# Patient Record
Sex: Female | Born: 1940
Health system: Southern US, Community
[De-identification: ages and names within clinical notes are randomized; demographics above are authoritative.]

## PROBLEM LIST (undated history)

## (undated) ENCOUNTER — Emergency Department (HOSPITAL_COMMUNITY): Payer: Medicare HMO

## (undated) DIAGNOSIS — B399 Histoplasmosis, unspecified: Secondary | ICD-10-CM

## (undated) DIAGNOSIS — K759 Inflammatory liver disease, unspecified: Secondary | ICD-10-CM

## (undated) DIAGNOSIS — J309 Allergic rhinitis, unspecified: Secondary | ICD-10-CM

## (undated) DIAGNOSIS — E162 Hypoglycemia, unspecified: Secondary | ICD-10-CM

## (undated) DIAGNOSIS — L409 Psoriasis, unspecified: Secondary | ICD-10-CM

## (undated) DIAGNOSIS — M810 Age-related osteoporosis without current pathological fracture: Secondary | ICD-10-CM

## (undated) DIAGNOSIS — K859 Acute pancreatitis without necrosis or infection, unspecified: Secondary | ICD-10-CM

## (undated) DIAGNOSIS — J189 Pneumonia, unspecified organism: Secondary | ICD-10-CM

## (undated) DIAGNOSIS — I341 Nonrheumatic mitral (valve) prolapse: Secondary | ICD-10-CM

## (undated) DIAGNOSIS — K5909 Other constipation: Secondary | ICD-10-CM

## (undated) DIAGNOSIS — E785 Hyperlipidemia, unspecified: Secondary | ICD-10-CM

## (undated) DIAGNOSIS — E039 Hypothyroidism, unspecified: Secondary | ICD-10-CM

## (undated) HISTORY — PX: ESOPHAGEAL DILATION: SHX303

## (undated) HISTORY — PX: KNEE ARTHROSCOPY: SUR90

## (undated) HISTORY — DX: Other constipation: K59.09

## (undated) HISTORY — DX: Acute pancreatitis without necrosis or infection, unspecified: K85.90

## (undated) HISTORY — DX: Allergic rhinitis, unspecified: J30.9

## (undated) HISTORY — DX: Age-related osteoporosis without current pathological fracture: M81.0

## (undated) HISTORY — DX: Psoriasis, unspecified: L40.9

## (undated) HISTORY — DX: Histoplasmosis, unspecified: B39.9

## (undated) HISTORY — PX: LIVER RESECTION: SHX1977

## (undated) HISTORY — PX: CATARACT EXTRACTION: SUR2

## (undated) HISTORY — DX: Pneumonia, unspecified organism: J18.9

## (undated) HISTORY — PX: CHOLECYSTECTOMY: SHX55

## (undated) HISTORY — DX: Hypothyroidism, unspecified: E03.9

## (undated) HISTORY — DX: Hyperlipidemia, unspecified: E78.5

## (undated) HISTORY — PX: ABDOMINAL HYSTERECTOMY: SHX81

## (undated) HISTORY — DX: Nonrheumatic mitral (valve) prolapse: I34.1

## (undated) HISTORY — PX: APPENDECTOMY: SHX54

---
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
## 1968-11-03 HISTORY — PX: TOTAL ABDOMINAL HYSTERECTOMY W/ BILATERAL SALPINGOOPHORECTOMY: SHX83

## 1970-11-03 DIAGNOSIS — B399 Histoplasmosis, unspecified: Secondary | ICD-10-CM

## 1970-11-03 HISTORY — DX: Histoplasmosis, unspecified: B39.9

## 1983-11-04 HISTORY — PX: OTHER SURGICAL HISTORY: SHX169

## 1998-03-19 ENCOUNTER — Other Ambulatory Visit: Admission: RE | Admit: 1998-03-19 | Discharge: 1998-03-19 | Payer: Self-pay | Admitting: *Deleted

## 1998-03-28 ENCOUNTER — Ambulatory Visit (HOSPITAL_COMMUNITY): Admission: RE | Admit: 1998-03-28 | Discharge: 1998-03-28 | Payer: Self-pay | Admitting: *Deleted

## 1998-03-30 ENCOUNTER — Ambulatory Visit (HOSPITAL_COMMUNITY): Admission: RE | Admit: 1998-03-30 | Discharge: 1998-03-30 | Payer: Self-pay | Admitting: *Deleted

## 1998-07-31 ENCOUNTER — Ambulatory Visit (HOSPITAL_COMMUNITY): Admission: RE | Admit: 1998-07-31 | Discharge: 1998-07-31 | Payer: Self-pay | Admitting: Gastroenterology

## 1999-03-22 ENCOUNTER — Other Ambulatory Visit: Admission: RE | Admit: 1999-03-22 | Discharge: 1999-03-22 | Payer: Self-pay | Admitting: *Deleted

## 1999-08-03 ENCOUNTER — Encounter: Payer: Self-pay | Admitting: Neurosurgery

## 1999-08-03 ENCOUNTER — Ambulatory Visit (HOSPITAL_COMMUNITY): Admission: RE | Admit: 1999-08-03 | Discharge: 1999-08-03 | Payer: Self-pay | Admitting: Neurosurgery

## 1999-08-03 ENCOUNTER — Encounter: Payer: Self-pay | Admitting: Family Medicine

## 1999-08-19 ENCOUNTER — Ambulatory Visit (HOSPITAL_COMMUNITY): Admission: RE | Admit: 1999-08-19 | Discharge: 1999-08-19 | Payer: Self-pay | Admitting: Neurosurgery

## 1999-08-19 ENCOUNTER — Encounter: Payer: Self-pay | Admitting: Neurosurgery

## 1999-09-02 ENCOUNTER — Encounter: Payer: Self-pay | Admitting: Neurosurgery

## 1999-09-02 ENCOUNTER — Ambulatory Visit (HOSPITAL_COMMUNITY): Admission: RE | Admit: 1999-09-02 | Discharge: 1999-09-02 | Payer: Self-pay | Admitting: Neurosurgery

## 1999-09-05 ENCOUNTER — Ambulatory Visit (HOSPITAL_COMMUNITY): Admission: RE | Admit: 1999-09-05 | Discharge: 1999-09-05 | Payer: Self-pay | Admitting: Family Medicine

## 1999-09-05 ENCOUNTER — Encounter: Payer: Self-pay | Admitting: Family Medicine

## 1999-11-04 DIAGNOSIS — K859 Acute pancreatitis without necrosis or infection, unspecified: Secondary | ICD-10-CM

## 1999-11-04 HISTORY — PX: LYSIS OF ADHESION: SHX5961

## 1999-11-04 HISTORY — DX: Acute pancreatitis without necrosis or infection, unspecified: K85.90

## 1999-12-27 ENCOUNTER — Encounter: Payer: Self-pay | Admitting: Surgery

## 1999-12-31 ENCOUNTER — Inpatient Hospital Stay (HOSPITAL_COMMUNITY): Admission: RE | Admit: 1999-12-31 | Discharge: 2000-01-06 | Payer: Self-pay | Admitting: Surgery

## 2000-04-24 ENCOUNTER — Encounter: Payer: Self-pay | Admitting: Neurosurgery

## 2000-04-24 ENCOUNTER — Ambulatory Visit (HOSPITAL_COMMUNITY): Admission: RE | Admit: 2000-04-24 | Discharge: 2000-04-24 | Payer: Self-pay | Admitting: Neurosurgery

## 2000-05-03 HISTORY — PX: LUMBAR DISC SURGERY: SHX700

## 2000-05-19 ENCOUNTER — Inpatient Hospital Stay (HOSPITAL_COMMUNITY): Admission: RE | Admit: 2000-05-19 | Discharge: 2000-05-19 | Payer: Self-pay | Admitting: Neurosurgery

## 2000-05-19 ENCOUNTER — Encounter: Payer: Self-pay | Admitting: Neurosurgery

## 2000-06-17 ENCOUNTER — Encounter: Admission: RE | Admit: 2000-06-17 | Discharge: 2000-06-17 | Payer: Self-pay | Admitting: Family Medicine

## 2000-06-17 ENCOUNTER — Encounter: Payer: Self-pay | Admitting: Obstetrics & Gynecology

## 2000-06-23 ENCOUNTER — Encounter: Admission: RE | Admit: 2000-06-23 | Discharge: 2000-06-23 | Payer: Self-pay | Admitting: Obstetrics & Gynecology

## 2000-06-23 ENCOUNTER — Encounter: Payer: Self-pay | Admitting: Obstetrics & Gynecology

## 2000-06-23 ENCOUNTER — Other Ambulatory Visit: Admission: RE | Admit: 2000-06-23 | Discharge: 2000-06-23 | Payer: Self-pay | Admitting: Obstetrics and Gynecology

## 2000-07-23 ENCOUNTER — Ambulatory Visit (HOSPITAL_COMMUNITY): Admission: RE | Admit: 2000-07-23 | Discharge: 2000-07-23 | Payer: Self-pay | Admitting: Gastroenterology

## 2000-11-10 ENCOUNTER — Encounter: Payer: Self-pay | Admitting: Emergency Medicine

## 2000-11-10 ENCOUNTER — Inpatient Hospital Stay (HOSPITAL_COMMUNITY): Admission: EM | Admit: 2000-11-10 | Discharge: 2000-11-12 | Payer: Self-pay | Admitting: Emergency Medicine

## 2000-11-11 ENCOUNTER — Encounter: Payer: Self-pay | Admitting: Gastroenterology

## 2000-12-06 ENCOUNTER — Emergency Department (HOSPITAL_COMMUNITY): Admission: EM | Admit: 2000-12-06 | Discharge: 2000-12-06 | Payer: Self-pay | Admitting: Emergency Medicine

## 2001-01-28 ENCOUNTER — Encounter: Admission: RE | Admit: 2001-01-28 | Discharge: 2001-01-28 | Payer: Self-pay | Admitting: Family Medicine

## 2001-01-28 ENCOUNTER — Encounter: Payer: Self-pay | Admitting: Family Medicine

## 2001-05-01 ENCOUNTER — Encounter: Payer: Self-pay | Admitting: Family Medicine

## 2001-05-01 ENCOUNTER — Ambulatory Visit (HOSPITAL_COMMUNITY): Admission: RE | Admit: 2001-05-01 | Discharge: 2001-05-01 | Payer: Self-pay | Admitting: Family Medicine

## 2001-08-18 ENCOUNTER — Other Ambulatory Visit: Admission: RE | Admit: 2001-08-18 | Discharge: 2001-08-18 | Payer: Self-pay | Admitting: *Deleted

## 2002-03-23 ENCOUNTER — Encounter: Payer: Self-pay | Admitting: *Deleted

## 2002-03-23 ENCOUNTER — Encounter: Admission: RE | Admit: 2002-03-23 | Discharge: 2002-03-23 | Payer: Self-pay | Admitting: *Deleted

## 2002-05-09 ENCOUNTER — Ambulatory Visit (HOSPITAL_COMMUNITY): Admission: RE | Admit: 2002-05-09 | Discharge: 2002-05-09 | Payer: Self-pay | Admitting: Rheumatology

## 2002-05-09 ENCOUNTER — Encounter: Payer: Self-pay | Admitting: Rheumatology

## 2002-09-08 ENCOUNTER — Other Ambulatory Visit: Admission: RE | Admit: 2002-09-08 | Discharge: 2002-09-08 | Payer: Self-pay | Admitting: *Deleted

## 2003-01-16 ENCOUNTER — Encounter: Payer: Self-pay | Admitting: Family Medicine

## 2003-01-16 ENCOUNTER — Encounter: Admission: RE | Admit: 2003-01-16 | Discharge: 2003-01-16 | Payer: Self-pay | Admitting: Family Medicine

## 2003-02-02 ENCOUNTER — Encounter: Admission: RE | Admit: 2003-02-02 | Discharge: 2003-02-02 | Payer: Self-pay | Admitting: Family Medicine

## 2003-02-02 ENCOUNTER — Encounter: Payer: Self-pay | Admitting: Family Medicine

## 2003-05-22 ENCOUNTER — Encounter: Payer: Self-pay | Admitting: Orthopedic Surgery

## 2003-05-22 ENCOUNTER — Encounter: Admission: RE | Admit: 2003-05-22 | Discharge: 2003-05-22 | Payer: Self-pay | Admitting: Orthopedic Surgery

## 2003-06-02 ENCOUNTER — Ambulatory Visit (HOSPITAL_COMMUNITY): Admission: RE | Admit: 2003-06-02 | Discharge: 2003-06-02 | Payer: Self-pay | Admitting: Orthopedic Surgery

## 2003-10-08 ENCOUNTER — Emergency Department (HOSPITAL_COMMUNITY): Admission: EM | Admit: 2003-10-08 | Discharge: 2003-10-08 | Payer: Self-pay | Admitting: Emergency Medicine

## 2004-05-07 ENCOUNTER — Encounter: Admission: RE | Admit: 2004-05-07 | Discharge: 2004-05-07 | Payer: Self-pay | Admitting: Obstetrics and Gynecology

## 2004-05-16 ENCOUNTER — Other Ambulatory Visit: Admission: RE | Admit: 2004-05-16 | Discharge: 2004-05-16 | Payer: Self-pay | Admitting: Obstetrics and Gynecology

## 2005-05-12 ENCOUNTER — Encounter: Admission: RE | Admit: 2005-05-12 | Discharge: 2005-05-12 | Payer: Self-pay | Admitting: Obstetrics and Gynecology

## 2005-05-19 ENCOUNTER — Other Ambulatory Visit: Admission: RE | Admit: 2005-05-19 | Discharge: 2005-05-19 | Payer: Self-pay | Admitting: Obstetrics and Gynecology

## 2006-05-14 ENCOUNTER — Encounter: Admission: RE | Admit: 2006-05-14 | Discharge: 2006-05-14 | Payer: Self-pay | Admitting: Obstetrics and Gynecology

## 2006-11-13 ENCOUNTER — Ambulatory Visit (HOSPITAL_COMMUNITY): Admission: RE | Admit: 2006-11-13 | Discharge: 2006-11-13 | Payer: Self-pay | Admitting: Family Medicine

## 2007-06-29 ENCOUNTER — Encounter: Admission: RE | Admit: 2007-06-29 | Discharge: 2007-06-29 | Payer: Self-pay | Admitting: Obstetrics and Gynecology

## 2007-08-23 ENCOUNTER — Emergency Department (HOSPITAL_COMMUNITY): Admission: EM | Admit: 2007-08-23 | Discharge: 2007-08-23 | Payer: Self-pay | Admitting: Emergency Medicine

## 2007-12-19 ENCOUNTER — Encounter: Admission: RE | Admit: 2007-12-19 | Discharge: 2007-12-19 | Payer: Self-pay | Admitting: Neurosurgery

## 2008-09-04 ENCOUNTER — Encounter: Admission: RE | Admit: 2008-09-04 | Discharge: 2008-09-04 | Payer: Self-pay | Admitting: Orthopedic Surgery

## 2010-08-08 ENCOUNTER — Encounter: Admission: RE | Admit: 2010-08-08 | Discharge: 2010-08-08 | Payer: Self-pay | Admitting: Family Medicine

## 2011-03-21 NOTE — Consult Note (Signed)
[facility]  Patient:    Cheryl Nash, Cheryl Nash                      MRN: [id number] Proc. Date: 11/10/00 Adm. Date:  [id number] Attending:  Nolene Ebbs Iv CC:         Arvella Merles, M.D.  Selina Cooley, M.D.   Consultation Report  DATE OF BIRTH:  1941/07/08  REASON FOR CONSULTATION:  Acute pancreatitis.  ASSESSMENT:  1. Severe acute pancreatitis, question etiology.  Patient has recently     received estrogen supplementation in addition to her estrogen patches     (Estring).  2. History of minor sphincteroplasty done in 1985 for pancreatic disease.  3. History of hepatic resection for a benign hepatic adenoma in 1983.  4. History of hypothyroidism with recent increase in TSH being adjusted.  5. Recent constipation probably secondary to her hypothyroidism.  6. Back surgery last year.  7. History of hysterectomy and right oophorectomy 1970.  8. Exploratory laparotomy and left oophorectomy 1983.  9. Remote history of questionable pulmonary ______. 10. History of esophageal dysmotility treated with calcium channel blockers in     the early 80s with a working diagnosis of (______ esophagus).  RECOMMENDATIONS: 1. Agree with aggressive treatment with IV fluids and ______ and pain    control. 2. Repeat lipase levels. 3. CT of the abdomen, pelvis in the morning to evaluate the pancreas. 4. To continue all estrogen supplements. 5. Correct TSH with thyroid supplements.  DISCUSSION:  [name] is a 70 year old white female who was initially seen and examined by me in my office in August 2001 for problems of chronic constipation.  Patient was evaluated by colonoscopy because of her family history of colon cancer in her grandfather and was found to have a healthy appearing colon except for small nonbleeding internal hemorrhoids.  She did very well on a high fiber diet until yesterday morning when she developed some epigastric  discomfort and pain requiring a visit to the [facility] Emergency Room.  Further evaluation revealed severe pancreatitis with a lipase of 8423 and amylase of 5236 and the patient was therefore admitted for further evaluation, pain control, and hydration.  Patient has a complicated history in the fact that she has had a sphincteroplasty done in 1983 at the [place] [facility] [place] in [place] for recurrent bouts of abdominal pain which was attributed to a possible pancreatitis secondary to pancreatic disease.  Patient, however, has had no problems since then until yesterday in that regard.  The patient saw a new gynecologist on December 21 and was started on Estring vaginal ring for supplementing her estrogen because of vaginal dryness.  Patient developed abdominal discomfort within two weeks of this and severe discomfort requiring hospitalization as mentioned above.  No other medical complaints have been listed in the recent past.  Patient denies any recent new medications.  There is no history of fever, chills, rigors, melena, or hematochezia.  Her weight has been stable.  Appetite has been slightly decreased in the last few days. She denies any genitourinary, cardiorespiratory complaints.  Her constipation has been well under control since she started a high fiber diet after colonoscopy that was done on July 23, 2000.  ALLERGIES:  FURADANTIN, CEPHALOSPORIN, DEMEROL, VICODIN, other CODEINE containing medications, CELEBREX, VIOXX.  MEDICATIONS:  Multivitamins, Estraderm patch, vitamin C, vitamin E, calcium supplements, Ultram for back pain.  SOCIAL HISTORY:  The patient is married and lives  [place] her [place]  She has four grown children.  She denies use of alcohol, tobacco, or drugs.  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  FAMILY HISTORY:  Her father died of complications of Parkinsons at 69.  Her mother died of an MI at 16.   Her maternal grandfather had colon cancer diagnosed at 26 and died at 44.  She has a brother and two sisters who are healthy.  History of diabetes in paternal grandmother and heart disease in mother and maternal grandmother.  There is no family history of breast, ovarian, or uterine cancer.  REVIEW OF SYSTEMS: 1. Epigastric pain with slight nausea. 2. No history of BRBPR or melena. 3. No fevers, chills, or rigors. 4. Back pain.  PHYSICAL EXAMINATION:  GENERAL:  Very pleasant middle-aged white female who seems in no acute distress.  VITAL SIGNS:  Temperature 97.6, blood pressure 143/79, pulse 68 per minute, respiratory rate 18.  HEENT:  Atraumatic, normocephalic head.  Oropharyngeal mucosa without exudate.  NECK:  Supple.  No JVD, thyromegaly, lymphadenopathy.  CHEST:  Clear to auscultation.  No rales, rhonchi, or wheezing.  CARDIAC:  S1, S2 regular.  No murmur, rub, or gallop.  ABDOMEN:  Soft with multiple surgical scars from the above mentioned surgeries.  Diffusely tender in the epigastrium and periumbilical area with guarding.  No rebound, rigidity, hepatosplenomegaly.  No other masses palpable.  RECTAL:  Deferred.  LABORATORIES:  White count 12,500 on admission with hemoglobin 14.6, hematocrit 38.6, MCV 87.3, platelet count 270,000 and 87% neutrophils.  PT 12.8, INR 1, PTT 31.  Sodium 137, potassium 3.7, chloride 103, CO2 25, glucose 117, BUN 10, creatinine 0.9, total bilirubin 0.5, alkaline phosphatase 77, AST 39, ALT 29, total protein 7.1, albumin 4.4, calcium 10.3, amylase 5236, lipase 8423.  CK 76, CK-MB 1.1.  Considering the above data, recommendations have been made.  I will try to procure the details of her medical history from Dr. Molly Maduro Buccinis office who has seen the patient in the past and has her records from [place], [place] and make further recommendations.  I will continue to follow the patient with  Dr. Selina Cooley who is [facility] hospitalist and  is helping take care of the patient for Dr. Holley Bouche. DD:  11/11/00 TD:  11/11/00 Job: [id number]

## 2011-03-21 NOTE — Op Note (Signed)
[place]. [facility]  Patient:    Cheryl Nash, Cheryl Nash                      MRN: [id number] Proc. Date: 05/19/00 Adm. Date:  [id number] Attending:  Emeterio Reeve                           Operative Report  PREOPERATIVE DIAGNOSIS:  Herniated disk L4-5 on the left.  POSTOPERATIVE DIAGNOSIS:  Herniated disk L4-5 on the left.  OPERATION PERFORMED:  Left L4-5 laminectomy and diskectomy.  SURGEON:  Payton Doughty, M.D.  ANESTHESIA:  General endotracheal.  PREP:  Sterile Betadine prep and scrub with alcohol wipe.  COMPLICATIONS:  None.  DESCRIPTION OF PROCEDURE:  The patient is a 70 year old right-handed white female with a left herniated disk at L4-5.  The patient was taken to the operating room, smoothly anesthetized and intubated, and placed prone on the operating table.  Following shave, prep and drape in the usual sterile fashion the skin was infiltrated with 1% lidocaine 1:400,000 epinephrine.  The skin was incised from the bottom of L3 to the top of L5 and the lamina of L4 was exposed on the left side in the subperiosteal plane.  Intraoperative x-ray confirmed correctness of the level.  Hemisemilaminectomy of L4 was carried out with removal of ligamentum flavum in a retrograde fashion and some undercutting of L5.  A generous foraminotomy was carried out over the L5 root as it rounded the pedicle.  The nerve root was then retracted medially. Immediately obvious was a large herniated disk which was grasped and removed without difficulty.  Annular fibers were then divided and a large tear medially and posteriorly of the annulus of the L4-5 disk was also demonstrated.  The disk space was carefully explored and all graspable fragments were removed.  A lateral exploration similarly removed all graspable fragments.  The nerve root was carefully explored and all quadrants found to be free.  The wound was irrigated and hemostasis assured.  The Depo-Medrol soaked  fat was used to cover the nerve root in the laminectomy defect.  The fascia was then reapproximated with 0 Vicryl in interrupted fashion. Subcutaneous tissues reapproximated with 0 Vicryl in interrupted fashion. Subcuticular tissues reapproximated with 3-0 Vicryl in interrupted fashion. The skin was closed with 4-0 Vicryl in running subcuticular fashion.  Benzoin and Steri-Strips were placed and made occlusive with Telfa and Op-Site.  The patient was then returned to the recovery room in good condition. DD:  05/19/00 TD:  05/19/00 Job: [id number]

## 2011-03-21 NOTE — Discharge Summary (Signed)
[place]. [facility]  Patient:    Cheryl Nash, Cheryl Nash                      MRN: [id number] Adm. Date:  [id number] Disc. Date: [id number] Attending:  Emeterio Reeve                           Discharge Summary  ADMITTING DIAGNOSIS:  Herniated disk L4-5 on the left.  DISCHARGE DIAGNOSIS:  Herniated disk L4-5 on the left.  OPERATIVE PROCEDURES:  L4-5 laminectomy, diskectomy.  COMPLICATIONS:  None.  DISCHARGE STATUS:  Alive and well.  HISTORY OF PRESENT ILLNESS:  This is a 70 year old right-handed white lady whose history and physical is recounted in the chart.  She had a herniated disk at L4-L5 off the left side.  She has minimal medical problems and uses only Percocet and vitamins.  PHYSICAL EXAMINATION:  GENERAL:  Unremarkable.  NEUROLOGIC:  Left L5 radiculopathy.  HOSPITAL COURSE:   The patient was admitted after ascertainment of normal laboratory values and underwent a 4-5 laminectomy and diskectomy on the left side.  Postoperatively, she has done extremely well.  Her strength is full. Her incision is dry.  She is eating and voiding normally and wants to go home. I am writing her Percocet for pain.  FOLLOW-UP:  Will be in the [facility] office in two weeks. DD:  05/19/00 TD:  05/21/00 Job: [id number]

## 2011-03-21 NOTE — Op Note (Signed)
[place]. [facility]  Patient:    Cheryl Nash, Cheryl Nash                      MRN: [id number] Proc. Date: 12/31/99 Adm. Date:  [id number] Attending:  Katha Cabal CC:         Soyla Murphy. Renne Crigler, M.D.             Florencia Reasons, M.D.             Arvella Merles, M.D.             Payton Doughty, M.D.                           Operative Report  PREOPERATIVE DIAGNOSIS:  Recurrent bouts of abdominal pain, nausea and vomiting  with prior history of hepatectomy and pelvic oophorectomy.  POSTOPERATIVE DIAGNOSIS:  Partially and intermittently obstructing adhesive band of the terminal ileum to the pelvis.  OPERATION PERFORMED:  Exploratory laparotomy with adhesiolysis.  SURGEON:  Thornton Park. Daphine Deutscher, M.D.  ASSISTANT:  Rose Phi. Maple Hudson, M.D.  ANESTHESIA:  General.  DESCRIPTION OF PROCEDURE:  The patient was taken to operating room 6 on the afternoon of February 27 and given general anesthesia.  The PAS hose were in place and a bear hugger was on the top.  The abdomen was prepped with Betadine and draped sterilely.  A small midline incision was made from just above her umbilicus to below her umbilicus in her old scar.  I entered the abdomen gently without difficulty and opened up the midline with the electrocautery.  I was then able o survey the abdomen.  In the left upper quadrant there were adhesions to her splenic flexure where her chevron incision was and I took these down with sharp dissection. Essentially no bleeding was seen.  On the right side the omentum was stuck up to her previous part of her chevron incision but was nonobstructed.  Next, I identified the ligament of Treitz and found very free small intestine in the jejunum and I walked step by step down through the bowel finding it all normal-appearing.  However, approximately two feet from the terminal ileum, a loop of ileum was densely adherent into the pelvis in a band-like adhesion.   This partially compromised the lumen of the bowel and also provided a band for loops to become intermittently and partially obstructed or possibly for partial volvulus of the small intestine to occur without partial obstruction.  These bands were lysed sharply and then I went ahead and followed this down to the terminal ileum where I freed up a few other adhesions which might have been tracting on her right side, reducing some discomfort.  I felt of the inguinal regions and could feel no evidence of any hernias.  Everything looked at this point good.  There was no evidence of any enterotomies and since I basically just took down omental adhesions in the upper abdomen and  then these isolated band adhesions in the terminal ileum.  The colon itself looked fine, the pelvis was unremarkable.  I irrigated with some saline.  I closed the  fascia with a running #1 PDS from above and below and tied in the middle.  I irrigayted the wound.  I approximated the skin edges with nylon as well as with  staples.  The patient tolerated the procedure well and was taken to the recovery room in  satisfactory condition. DD:  12/31/99 TD:  12/31/99 Job: [id number]

## 2011-03-21 NOTE — Discharge Summary (Signed)
[facility]  Patient:    Cheryl Nash, Cheryl Nash                      MRN: [id number] Adm. Date:  [id number] Disc. Date: [id number] Attending:  Nolene Ebbs Iv CC:         Arvella Merles, M.D.  Anselmo Rod, M.D.   Discharge Summary  DISCHARGE DIAGNOSES: 1. Acute pancreatitis, admission lipase 8423, admission amylase 5236,    discharge lipase of 39 presumed to be due to pancreatic divisum. 2. History of pancreatic divisum and prior epigastric pain status post    exploratory laparotomy, pancreatogram and minor papilla sphincteroplasty    in December of 1985 at the [facility] in [place],    [place]. Urinary amylase elevated at that time. 3. History of failed pancreatogram by ERCP resulting in exploratory laparotomy    surgery noted above. DD:  11/12/00 TD:  11/13/00 Job: [id number]

## 2011-03-21 NOTE — Discharge Summary (Signed)
[facility]  Patient:    Cheryl Nash, Cheryl Nash                      MRN: [id number] Adm. Date:  [id number] Disc. Date: [id number] Attending:  Nolene Ebbs Iv CC:         Arvella Merles, M.D.  Anselmo Rod, M.D.   Discharge Summary  DISCHARGE DIAGNOSES:  1. Acute pancreatitis, lipase of 8423, amylase of 5236, discharge lipase     of 39 presumed to be due to pancreatic divisum possibly exacerbated     by estrogen supplementation.  2. History of pancreatic divisum status post exploratory laparotomy,     pancreatogram and minor papillary sphincteroplasty in 1985 at the     [place] [facility] [place], [place]. History of failed pancreatogram by     endoscopic retrograde cholangiopancreatography resulting in the above     mentioned surgery. History of pancreatitis in 1985 associated with     elevated urinary amylase.  3. History of hemangioadenoma status post partial hepatic resection involving     the right hepatic lobe in 1983 by Dr. Jodie Echevaria of [facility],     concurrent cholecystectomy at that time.  4. Hysterectomy with left oophorectomy in 1970 for endometriosis, subsequent     right oophorectomy in 1982.  5. History of lysis of adhesions in March of 2001.  6. History of hypothyroidism diagnosed in August 2001, TSH this admission of     8.6.  7. Chronic constipation.  8. Internal hemorrhoids by colonoscopy in September of 2001.  9. History of nutcracker esophagus with increased lower esophageal sphincter     pressures by manometry in 1986. Pressures exceeding 50 mmHg. 10. Status post L4, L5 microdiskectomy in July of 2001. 11. Status post appendectomy in the 1970s status post tonsillectomy in     1948. 12. Mitral valve prolapse. 13. Post menopausal on hormone replacement therapy and recently started on     Estring for vaginal dryness on October 23, 2000. 14. History of histoplasmosis with pulmonary calcifications. 15. Lingular nodule  noted on CT this admission. 16. Sinus congestion/headache. 17. Multiple medical allergies and drug intolerances including Macrodantin     causing anaphylaxis, Vioxx, celebrex, NSAIDs causing pruritus including     Vicodin as well. Aspirin resulting in angioedema, Demerol leading to     twitching. Intolerance to calcium channel blocks.  REASON FOR ADMISSION:  The patient is a 70 year old white female with above mentioned paste medical history notably history of prior episodes of pancreatitis with pancreatic divisum diagnosed in the 80s who presented with epigastric pain and chest tightness with acute onset associated with nausea and vomiting, burning in quality nonradiating. The patient notes that she was recently started on an Estring estrogen suppository for vaginal dryness the week prior to admission and noticed some lower abdominal cramping. She had discontinued this supplement the day prior to admission.  DISCHARGE MEDICATIONS:  1. Synthroid 50 mcg p.o. q.d.  2. Nasonex 2 sprays each nostril q.d. The patient was advised to discontinue     the Estring and Estraderm supplementation.  DISCHARGE INSTRUCTIONS:  Follow a low fat diet. Follow-up with Dr. Loreta Ave in 2-3 weeks, Dr. Tiburcio Pea in 3-4 weeks. Would recommend rechecking TSH in approximately 1 months time and repeat limited chest CT through the lower chest to evaluate lingular nodule noted on abdominal CT this admission.  CONDITION ON DISCHARGE:  The patient is feeling well, afebrile with stable vital signs, tolerating  p.o. with resolution of her abdominal pain, nausea and vomiting.  HOSPITAL COURSE BY PROBLEM: #1 - ACUTE PANCREATITIS. As noted above, her lipase and amylase were markedly elevated. She was given IV fluids, morphine and antiemetics with progressive improvement in her symptoms and rapid resolution of her abnormal pancreatic enzymes. LFTs remained normal throughout the hospital stay, estrogens were withheld throughout  the hospitalization and she was encouraged not to resume them until further notice. Dr. Loreta Ave of gastroenterology consulted on this case. The patient is now able to tolerate p.o. and will be discharged to home.  #2 - HYPOTHYROIDISM. The patients TSH is slightly elevated and we will increase Synthroid to 50 mcg p.o. q.d. TSH should be rechecked in approximately one months time.  #3 - NASAL CONGESTION AND HEADACHE. The patient reported severe headache the second day of admission presumed to be due to either sinus congestion versus reaction to a medication potentially morphine. Headache resolved on discharge and she was also treated with Nasonex and Afrin.  #4 - HISTORY OF BENIGN HEPATIC TUMOR STATUS POST RIGHT LOBE RESECTION OF THE LIVER. As noted above LFTs were normal this admission.  #5 - LEUKOCYTOSIS SECONDARY TO PANCREATITIS, RESOLVING ON DISCHARGE.  #6 - HYPOKALEMIA. Patient requiring supplementation.  #7 - LINGULAR NODULE. Incidental finding of a small lingular nodule on abdominal CT. No prior chest CTs were noted and the lesion is not detected on chest x-ray therefore unless further CTs can be tracked down would recommend follow-up CT, limited study of the lower chest in three months time to assure resolution or stability of this lesion. The patient does have a history of histoplasmosis and reported history of calcified lesions on x-ray so this may be benign remnant of histoplasmosis pulmonary disease.  DISCHARGE LABORATORY DATA:  Hemoglobin of 12.1, creatinine of 0.7, lipase of 39.  CONSULTATIONS:  Anselmo Rod, M.D., gastroenterology. DD:  11/12/00 TD:  11/13/00 Job: [id number]

## 2011-03-21 NOTE — Procedures (Signed)
[place]. [facility]  Patient:    Cheryl Nash, Cheryl Nash                      MRN: [id number] Proc. Date: 07/23/00 Adm. Date:  [id number] Attending:  Charna Elizabeth CC:         Arvella Merles, M.D.   Procedure Report  PROCEDURE:  Colonoscopy.  ENDOSCOPIST:  Anselmo Rod, M.D.  INSTRUMENT USED:  Olympus video colonoscope.  INDICATIONS:  Rectal bleeding in a 70 year old white female.  Rule out colonic polyps, masses, hemorrhoids, etc.  PREPROCEDURE PREPARATION:  Informed consent was procured from the patient. The patient was fasted for eight hours prior to the procedure and prepped with a bottle of magnesium citrate and a gallon of NuLytely the night prior to the procedure.  PREPROCEDURE PHYSICAL EXAMINATION:  VITAL SIGNS:  The patient has stable vital signs.  NECK:  Supple.  CHEST:  Clear to auscultation.  No rales, rhonchi or wheezing.  HEART:  S1 and S2 regular.  No murmur, rub or gallop.  ABDOMEN:  Soft with normal abdominal bowel sounds.  DESCRIPTION OF PROCEDURE:  The patient was placed in the left lateral decubitus position and sedated with 75 mcg of fentanyl and 7.5 mg of Versed intravenously.  Once the patient was adequately sedated and maintained on low flow oxygen and continuous cardiac monitoring, the Olympus video colonoscope was advanced from the rectum to the cecum without difficulty except for small nonbleeding internal hemorrhoids appreciated on retroflexion.  No other abnormalities were noted.  There was no evidence of diverticular disease, masses, polyps, erosions, ulcerations, etc.  The patient had some residual stool in the right colon, but visualization was adequate.  The patient tolerated the procedure well without immediate postoperative complications.  IMPRESSION:  Essentially normal colonoscopy except for small nonbleeding internal hemorrhoids.  RECOMMENDATIONS: 1. A high fiber diet has been recommended for the  patient. 2. Colorectal cancer screening has been recommended in the next 5-10    years again unless the patient has any abnormal symptoms in the interim. DD:  08/01/00 TD:  07/24/00 Job: [id number]

## 2011-03-21 NOTE — Discharge Summary (Signed)
[place]. [facility]  Patient:    Cheryl Nash, Cheryl Nash                      MRN: [id number] Adm. Date:  [id number] Disc. Date: [id number] Attending:  Katha Cabal CC:         Arvella Merles, M.D.             Florencia Reasons, M.D.                           Discharge Summary  OPERATION/PROCEDURE: On December 31, 1999 the patient underwent exploratory laparotomy with enterolysis, with findings of adhesion of terminal ileum down into her pelvis, with a large dense band.  HOSPITAL COURSE:  Cheryl Nash is a 70 year old lady with intermittent lower abdominal pain.  She underwent the above mentioned operation, which did have positive findings which correlated well with her symptoms.  She had this procedure along with lysis of adhesions in her left upper quadrant.  She had some low-grade fevers, probably related to atelectasis, but got along well. She was begun on a clear liquid diet and was advanced, and was ready for discharge on postoperative day #6.  The wound looked fine.  DISPOSITION:  Arrangements were made for her to follow up in my office.  FINAL DIAGNOSIS:  Band-like adhesions involving the small bowel, status post enterolysis.  DISCHARGE CONDITION:  Good. DD:  01/26/00 TD:  01/27/00 Job: [id number]

## 2011-07-06 ENCOUNTER — Emergency Department (HOSPITAL_COMMUNITY): Payer: Medicare Other

## 2011-07-06 ENCOUNTER — Emergency Department (HOSPITAL_COMMUNITY)
Admission: EM | Admit: 2011-07-06 | Discharge: 2011-07-06 | Disposition: A | Discharge status: Home / Self Care | Payer: Medicare Other | Attending: Emergency Medicine | Admitting: Emergency Medicine

## 2011-07-06 DIAGNOSIS — R6884 Jaw pain: Secondary | ICD-10-CM | POA: Insufficient documentation

## 2011-07-06 DIAGNOSIS — K047 Periapical abscess without sinus: Secondary | ICD-10-CM | POA: Insufficient documentation

## 2011-07-06 DIAGNOSIS — R220 Localized swelling, mass and lump, head: Secondary | ICD-10-CM | POA: Insufficient documentation

## 2011-07-06 DIAGNOSIS — R221 Localized swelling, mass and lump, neck: Secondary | ICD-10-CM | POA: Insufficient documentation

## 2011-07-06 LAB — DIFFERENTIAL
Basophils Absolute: 0 10*3/uL (ref 0.0–0.1)
Basophils Relative: 0 % (ref 0–1)
Eosinophils Absolute: 0.3 10*3/uL (ref 0.0–0.7)
Eosinophils Relative: 4 % (ref 0–5)
Lymphocytes Relative: 29 % (ref 12–46)
Lymphs Abs: 1.9 10*3/uL (ref 0.7–4.0)
Monocytes Absolute: 0.6 10*3/uL (ref 0.1–1.0)
Monocytes Relative: 9 % (ref 3–12)
Neutro Abs: 3.7 10*3/uL (ref 1.7–7.7)
Neutrophils Relative %: 57 % (ref 43–77)

## 2011-07-06 LAB — CBC
HCT: 40.5 % (ref 36.0–46.0)
Hemoglobin: 14.3 g/dL (ref 12.0–15.0)
MCH: 32.1 pg (ref 26.0–34.0)
MCHC: 35.3 g/dL (ref 30.0–36.0)
MCV: 91 fL (ref 78.0–100.0)
Platelets: 259 10*3/uL (ref 150–400)
RBC: 4.45 MIL/uL (ref 3.87–5.11)
RDW: 11.9 % (ref 11.5–15.5)
WBC: 6.4 10*3/uL (ref 4.0–10.5)

## 2011-07-06 LAB — POCT I-STAT, CHEM 8
BUN: 8 mg/dL (ref 6–23)
Calcium, Ion: 1.18 mmol/L (ref 1.12–1.32)
Chloride: 101 mEq/L (ref 96–112)
Creatinine, Ser: 0.9 mg/dL (ref 0.50–1.10)
Glucose, Bld: 97 mg/dL (ref 70–99)
HCT: 43 % (ref 36.0–46.0)
Hemoglobin: 14.6 g/dL (ref 12.0–15.0)
Potassium: 4.1 mEq/L (ref 3.5–5.1)
Sodium: 139 mEq/L (ref 135–145)
TCO2: 27 mmol/L (ref 0–100)

## 2011-07-06 MED ORDER — IOHEXOL 300 MG/ML  SOLN
100.0000 mL | Freq: Once | INTRAMUSCULAR | Status: AC | PRN
  Administered 2011-07-06: 100 mL via INTRAVENOUS

## 2011-08-13 LAB — COMPREHENSIVE METABOLIC PANEL
ALT: 38 — ABNORMAL HIGH
AST: 36
Albumin: 3.6
Alkaline Phosphatase: 114
BUN: 10
CO2: 25
Calcium: 9.1
Chloride: 103
Creatinine, Ser: 0.79
GFR calc Af Amer: >60
GFR calc non Af Amer: >60
Glucose, Bld: 102 — ABNORMAL HIGH
Potassium: 3.6
Sodium: 136
Total Bilirubin: 1
Total Protein: 6.3

## 2011-08-13 LAB — POCT CARDIAC MARKERS
CKMB, poc: 1.1
CKMB, poc: <1 — ABNORMAL LOW
Myoglobin, poc: 59.6
Myoglobin, poc: 75.2
Operator id: 2725
Operator id: 2725
Troponin i, poc: <0.05
Troponin i, poc: <0.05

## 2011-08-13 LAB — DIFFERENTIAL
Basophils Absolute: 0
Basophils Relative: 0
Eosinophils Absolute: 0.1
Eosinophils Relative: 1
Lymphocytes Relative: 3 — ABNORMAL LOW
Lymphs Abs: 0.4 — ABNORMAL LOW
Monocytes Absolute: 0.3
Monocytes Relative: 2 — ABNORMAL LOW
Neutro Abs: 12.5 — ABNORMAL HIGH
Neutrophils Relative %: 94 — ABNORMAL HIGH

## 2011-08-13 LAB — CBC
HCT: 44
Hemoglobin: 15.5 — ABNORMAL HIGH
MCHC: 35.2
MCV: 91.6
Platelets: 265
RBC: 4.81
RDW: 11.8
WBC: 13.3 — ABNORMAL HIGH

## 2011-08-13 LAB — LIPASE, BLOOD: Lipase: 20

## 2011-11-20 ENCOUNTER — Other Ambulatory Visit: Payer: Self-pay | Admitting: Family Medicine

## 2011-11-20 DIAGNOSIS — J3489 Other specified disorders of nose and nasal sinuses: Secondary | ICD-10-CM

## 2011-11-21 ENCOUNTER — Ambulatory Visit
Admission: RE | Admit: 2011-11-21 | Discharge: 2011-11-21 | Disposition: A | Discharge status: Home / Self Care | Payer: Medicare Other | Source: Ambulatory Visit | Attending: Family Medicine | Admitting: Family Medicine

## 2011-11-21 DIAGNOSIS — J3489 Other specified disorders of nose and nasal sinuses: Secondary | ICD-10-CM

## 2011-12-16 ENCOUNTER — Other Ambulatory Visit: Payer: Self-pay | Admitting: Family Medicine

## 2011-12-18 ENCOUNTER — Other Ambulatory Visit: Payer: Medicare Other

## 2012-08-12 ENCOUNTER — Other Ambulatory Visit: Payer: Self-pay | Admitting: Family Medicine

## 2012-08-12 DIAGNOSIS — Z1231 Encounter for screening mammogram for malignant neoplasm of breast: Secondary | ICD-10-CM

## 2012-09-14 ENCOUNTER — Ambulatory Visit
Admission: RE | Admit: 2012-09-14 | Discharge: 2012-09-14 | Disposition: A | Discharge status: Home / Self Care | Payer: Medicare Other | Source: Ambulatory Visit | Attending: Family Medicine | Admitting: Family Medicine

## 2012-09-14 DIAGNOSIS — Z1231 Encounter for screening mammogram for malignant neoplasm of breast: Secondary | ICD-10-CM

## 2012-12-08 ENCOUNTER — Other Ambulatory Visit: Payer: Self-pay | Admitting: Family Medicine

## 2012-12-08 DIAGNOSIS — R42 Dizziness and giddiness: Secondary | ICD-10-CM

## 2012-12-08 DIAGNOSIS — R51 Headache: Secondary | ICD-10-CM

## 2012-12-08 DIAGNOSIS — R29898 Other symptoms and signs involving the musculoskeletal system: Secondary | ICD-10-CM

## 2012-12-08 DIAGNOSIS — M542 Cervicalgia: Secondary | ICD-10-CM

## 2012-12-08 DIAGNOSIS — H9312 Tinnitus, left ear: Secondary | ICD-10-CM

## 2012-12-14 ENCOUNTER — Ambulatory Visit
Admission: RE | Admit: 2012-12-14 | Discharge: 2012-12-14 | Disposition: A | Discharge status: Home / Self Care | Payer: Medicare Other | Source: Ambulatory Visit | Attending: Family Medicine | Admitting: Family Medicine

## 2012-12-14 DIAGNOSIS — R51 Headache: Secondary | ICD-10-CM

## 2012-12-14 DIAGNOSIS — M542 Cervicalgia: Secondary | ICD-10-CM

## 2012-12-14 DIAGNOSIS — R42 Dizziness and giddiness: Secondary | ICD-10-CM

## 2012-12-14 DIAGNOSIS — H9312 Tinnitus, left ear: Secondary | ICD-10-CM

## 2012-12-14 DIAGNOSIS — R29898 Other symptoms and signs involving the musculoskeletal system: Secondary | ICD-10-CM

## 2012-12-14 MED ORDER — GADOBENATE DIMEGLUMINE 529 MG/ML IV SOLN
15.0000 mL | Freq: Once | INTRAVENOUS | Status: AC | PRN
  Administered 2012-12-14: 15 mL via INTRAVENOUS

## 2013-06-03 ENCOUNTER — Other Ambulatory Visit: Payer: Self-pay | Admitting: Family Medicine

## 2013-06-03 ENCOUNTER — Ambulatory Visit
Admission: RE | Admit: 2013-06-03 | Discharge: 2013-06-03 | Disposition: A | Discharge status: Home / Self Care | Payer: Medicare Other | Source: Ambulatory Visit | Attending: Family Medicine | Admitting: Family Medicine

## 2013-06-03 DIAGNOSIS — R52 Pain, unspecified: Secondary | ICD-10-CM

## 2013-06-03 MED ORDER — IOHEXOL 300 MG/ML  SOLN
100.0000 mL | Freq: Once | INTRAMUSCULAR | Status: AC | PRN
  Administered 2013-06-03: 100 mL via INTRAVENOUS

## 2013-06-03 MED ORDER — IOHEXOL 300 MG/ML  SOLN
30.0000 mL | Freq: Once | INTRAMUSCULAR | Status: AC | PRN
  Administered 2013-06-03: 30 mL via ORAL

## 2014-07-21 ENCOUNTER — Other Ambulatory Visit: Payer: Self-pay | Admitting: Gastroenterology

## 2014-07-21 DIAGNOSIS — R1031 Right lower quadrant pain: Secondary | ICD-10-CM

## 2014-08-02 ENCOUNTER — Ambulatory Visit
Admission: RE | Admit: 2014-08-02 | Discharge: 2014-08-02 | Disposition: A | Discharge status: Home / Self Care | Payer: Medicare Other | Source: Ambulatory Visit | Attending: Gastroenterology | Admitting: Gastroenterology

## 2014-08-02 DIAGNOSIS — R1031 Right lower quadrant pain: Secondary | ICD-10-CM

## 2015-02-22 ENCOUNTER — Other Ambulatory Visit: Payer: Self-pay | Admitting: Family Medicine

## 2015-02-22 DIAGNOSIS — Z1231 Encounter for screening mammogram for malignant neoplasm of breast: Secondary | ICD-10-CM

## 2015-03-01 ENCOUNTER — Ambulatory Visit
Admission: RE | Admit: 2015-03-01 | Discharge: 2015-03-01 | Disposition: A | Discharge status: Home / Self Care | Payer: Medicare Other | Source: Ambulatory Visit | Attending: Family Medicine | Admitting: Family Medicine

## 2015-03-01 ENCOUNTER — Other Ambulatory Visit: Payer: Self-pay | Admitting: Family Medicine

## 2015-03-01 DIAGNOSIS — Z1231 Encounter for screening mammogram for malignant neoplasm of breast: Secondary | ICD-10-CM

## 2015-03-02 ENCOUNTER — Other Ambulatory Visit: Payer: Self-pay | Admitting: Family Medicine

## 2015-03-02 DIAGNOSIS — R928 Other abnormal and inconclusive findings on diagnostic imaging of breast: Secondary | ICD-10-CM

## 2015-03-08 ENCOUNTER — Ambulatory Visit
Admission: RE | Admit: 2015-03-08 | Discharge: 2015-03-08 | Disposition: A | Discharge status: Home / Self Care | Payer: Medicare Other | Source: Ambulatory Visit | Attending: Family Medicine | Admitting: Family Medicine

## 2015-03-08 DIAGNOSIS — R928 Other abnormal and inconclusive findings on diagnostic imaging of breast: Secondary | ICD-10-CM

## 2015-11-07 DIAGNOSIS — M25552 Pain in left hip: Secondary | ICD-10-CM | POA: Diagnosis not present

## 2015-11-08 DIAGNOSIS — H2513 Age-related nuclear cataract, bilateral: Secondary | ICD-10-CM | POA: Diagnosis not present

## 2015-12-04 DIAGNOSIS — H25812 Combined forms of age-related cataract, left eye: Secondary | ICD-10-CM | POA: Diagnosis not present

## 2015-12-04 DIAGNOSIS — H2512 Age-related nuclear cataract, left eye: Secondary | ICD-10-CM | POA: Diagnosis not present

## 2015-12-18 DIAGNOSIS — H25811 Combined forms of age-related cataract, right eye: Secondary | ICD-10-CM | POA: Diagnosis not present

## 2015-12-18 DIAGNOSIS — H2511 Age-related nuclear cataract, right eye: Secondary | ICD-10-CM | POA: Diagnosis not present

## 2016-01-16 DIAGNOSIS — Z961 Presence of intraocular lens: Secondary | ICD-10-CM | POA: Diagnosis not present

## 2016-02-26 DIAGNOSIS — E7800 Pure hypercholesterolemia, unspecified: Secondary | ICD-10-CM | POA: Diagnosis not present

## 2016-02-26 DIAGNOSIS — N952 Postmenopausal atrophic vaginitis: Secondary | ICD-10-CM | POA: Diagnosis not present

## 2016-02-26 DIAGNOSIS — R1314 Dysphagia, pharyngoesophageal phase: Secondary | ICD-10-CM | POA: Diagnosis not present

## 2016-02-26 DIAGNOSIS — H903 Sensorineural hearing loss, bilateral: Secondary | ICD-10-CM | POA: Diagnosis not present

## 2016-02-26 DIAGNOSIS — Z0000 Encounter for general adult medical examination without abnormal findings: Secondary | ICD-10-CM | POA: Diagnosis not present

## 2016-02-26 DIAGNOSIS — M859 Disorder of bone density and structure, unspecified: Secondary | ICD-10-CM | POA: Diagnosis not present

## 2016-02-26 DIAGNOSIS — E039 Hypothyroidism, unspecified: Secondary | ICD-10-CM | POA: Diagnosis not present

## 2016-02-26 DIAGNOSIS — Z1211 Encounter for screening for malignant neoplasm of colon: Secondary | ICD-10-CM | POA: Diagnosis not present

## 2016-02-26 DIAGNOSIS — L219 Seborrheic dermatitis, unspecified: Secondary | ICD-10-CM | POA: Diagnosis not present

## 2016-03-07 DIAGNOSIS — M47817 Spondylosis without myelopathy or radiculopathy, lumbosacral region: Secondary | ICD-10-CM | POA: Diagnosis not present

## 2016-03-07 DIAGNOSIS — M545 Low back pain: Secondary | ICD-10-CM | POA: Diagnosis not present

## 2016-03-12 DIAGNOSIS — M5441 Lumbago with sciatica, right side: Secondary | ICD-10-CM | POA: Diagnosis not present

## 2016-03-12 DIAGNOSIS — M1612 Unilateral primary osteoarthritis, left hip: Secondary | ICD-10-CM | POA: Diagnosis not present

## 2016-03-12 DIAGNOSIS — M1611 Unilateral primary osteoarthritis, right hip: Secondary | ICD-10-CM | POA: Diagnosis not present

## 2016-03-14 ENCOUNTER — Encounter: Payer: Self-pay | Admitting: Gastroenterology

## 2016-04-03 DIAGNOSIS — H01114 Allergic dermatitis of left upper eyelid: Secondary | ICD-10-CM | POA: Diagnosis not present

## 2016-04-03 DIAGNOSIS — H01111 Allergic dermatitis of right upper eyelid: Secondary | ICD-10-CM | POA: Diagnosis not present

## 2016-05-13 DIAGNOSIS — H903 Sensorineural hearing loss, bilateral: Secondary | ICD-10-CM | POA: Diagnosis not present

## 2016-05-13 DIAGNOSIS — H6983 Other specified disorders of Eustachian tube, bilateral: Secondary | ICD-10-CM | POA: Diagnosis not present

## 2016-05-14 ENCOUNTER — Encounter: Payer: Self-pay | Admitting: Gastroenterology

## 2016-05-14 ENCOUNTER — Ambulatory Visit (INDEPENDENT_AMBULATORY_CARE_PROVIDER_SITE_OTHER): Payer: Commercial Managed Care - HMO | Admitting: Gastroenterology

## 2016-05-14 VITALS — BP 110/70 | HR 62 | Ht 68.0 in | Wt 172.0 lb

## 2016-05-14 DIAGNOSIS — Z1211 Encounter for screening for malignant neoplasm of colon: Secondary | ICD-10-CM

## 2016-05-14 DIAGNOSIS — R1314 Dysphagia, pharyngoesophageal phase: Secondary | ICD-10-CM | POA: Diagnosis not present

## 2016-05-14 DIAGNOSIS — Z1212 Encounter for screening for malignant neoplasm of rectum: Secondary | ICD-10-CM

## 2016-05-14 NOTE — Progress Notes (Signed)
History of Present Illness: This is 75 year old female referred by Leonides Sakeandy Harris, MD for the evaluation of dysphagia. She is accompanied by her husband. She relates a 3 year history of intermittent episodes of solid food dysphagia. She had a more severe episode with while eating chicken in September 2016. She took a nitroglycerin and the chicken eventually passed. Recently her calcium pill became lodged in her mid chest region leading to significant discomfort and it eventually passed on its own. She denies any typical or atypical reflux symptoms. No prior history of esophageal disease. She was previously followed by Dr. Kinnie ScalesMedoff in underwent colonoscopy in October 2009 that was normal. Denies weight loss, abdominal pain, constipation, diarrhea, change in stool caliber, melena, hematochezia, nausea, vomiting, reflux symptoms, chest pain.   Allergies  Allergen Reactions  . Amoxicillin     Fatigue, agitation and depression  . Augmentin [Amoxicillin-Pot Clavulanate]   . Celebrex [Celecoxib]   . Cephalexin   . Ciprofloxacin   . Codeine Sulfate   . Demerol [Meperidine]   . Ketek [Telithromycin]   . Levaquin [Levofloxacin In D5w]   . Macrodantin [Nitrofurantoin Macrocrystal]   . Septra [Sulfamethoxazole-Trimethoprim]   . Aspartame And Phenylalanine Rash  . Etodolac Rash   Outpatient Prescriptions Prior to Visit  Medication Sig Dispense Refill  . Aspirin-Caffeine 400-32 MG TABS Take 2 capsules by mouth daily.    . bisacodyl (DULCOLAX) 5 MG EC tablet Take 5 mg by mouth daily as needed for moderate constipation.    . chlorpheniramine (CHLOR-TRIMETON) 4 MG tablet Take 4 mg by mouth every 4 (four) hours as needed for allergies.    Marland Kitchen. EPINEPHrine (EPIPEN 2-PAK) 0.3 mg/0.3 mL IJ SOAJ injection Inject 0.3 mg into the muscle once.    Marland Kitchen. estradiol (ESTRACE) 0.1 MG/GM vaginal cream Place 1 Applicatorful vaginally 3 (three) times a week.    . levothyroxine (SYNTHROID, LEVOTHROID) 112 MCG tablet Take 112  mcg by mouth daily before breakfast.    . liothyronine (CYTOMEL) 5 MCG tablet Take 5 mcg by mouth daily.    . Magnesium Citrate POWD by Does not apply route daily.    . Multiple Vitamin (MULTIVITAMIN) tablet Take 1 tablet by mouth daily.    . nitroGLYCERIN (NITROSTAT) 0.4 MG SL tablet Place 0.4 mg under the tongue every 5 (five) minutes as needed for chest pain.    . Omega-3 Fatty Acids (FISH OIL) 1200 MG CAPS Take 1 capsule by mouth daily.     No facility-administered medications prior to visit.   Past Medical History  Diagnosis Date  . MVP (mitral valve prolapse)   . Hypothyroidism   . Chronic constipation   . Osteoporosis   . Psoriasis   . Pancreatitis 2001  . Pneumonia [phone],1981,1995  . Histoplasmosis 1972  . Hyperlipidemia   . Allergic rhinitis    Past Surgical History  Procedure Laterality Date  . Total abdominal hysterectomy w/ bilateral salpingoophorectomy  1970  . Appendectomy    . Liver resection    . Lysis of adhesion  2001  . Lumbar disc surgery  05/2000    L4-5  . Cholecystectomy    . Tonsillectomy and adenoidectomy  1948  . Cataract extraction Bilateral 11/2015,12/2015  . Pancreatic duct  1985    enlargement of duct   Social History   Social History  . Marital Status: Married    Spouse Name: Ron  . Number of Children: 4  . Years of Education: N/A   Occupational History  .  retired [employer] fundraiser    Social History Main Topics  . Smoking status: Never Smoker   . Smokeless tobacco: Never Used  . Alcohol Use: No  . Drug Use: No  . Sexual Activity: Not Asked   Other Topics Concern  . None   Social History Narrative   Family History  Problem Relation Age of Onset  . Heart attack Mother   . CAD Mother   . Colon cancer Maternal Grandfather   . Aneurysm Maternal Grandmother       Review of Systems: Pertinent positive and negative review of systems were noted in the above HPI section. All other review of systems were otherwise negative.   Physical  Exam: General: Well developed, well nourished, no acute distress Head: Normocephalic and atraumatic Eyes:  sclerae anicteric, EOMI Ears: Normal auditory acuity Mouth: No deformity or lesions Neck: Supple, no masses or thyromegaly Lungs: Clear throughout to auscultation Heart: Regular rate and rhythm; no murmurs, rubs or bruits Abdomen: Soft, non tender and non distended. No masses, hepatosplenomegaly or hernias noted. Normal Bowel sounds Musculoskeletal: Symmetrical with no gross deformities  Skin: No lesions on visible extremities Pulses:  Normal pulses noted Extremities: No clubbing, cyanosis, edema or deformities noted Neurological: Alert oriented x 4, grossly nonfocal Cervical Nodes:  No significant cervical adenopathy Inguinal Nodes: No significant inguinal adenopathy Psychological:  Alert and cooperative. Normal mood and affect  Assessment and Recommendations:  1. Dysphagia. Suspected esophageal stricture. Schedule barium esophagram and EGD with possible dilation. Advised to avoid large pills and meat until the evaluation is completed. The risks (including bleeding, perforation, infection, missed lesions, medication reactions and possible hospitalization or surgery if complications occur), benefits, and alternatives to endoscopy with possible biopsy and possible dilation were discussed with the patient and they consent to proceed.   2. CRC screening, average risk. 10 year interval screening colonoscopy recommended October 2019.   cc: Leonides Sake, MD

## 2016-05-14 NOTE — Patient Instructions (Signed)
You have been scheduled for a Barium Esophogram at [facility] (1st floor of the hospital) on 06/02/16  at 10:30am. Please arrive 15 minutes prior to your appointment for registration. Make certain not to have anything to eat or drink 3 hours prior to your test. If you need to reschedule for any reason, please contact radiology at [phone] to do so. __________________________________________________________________ A barium swallow is an examination that concentrates on views of the esophagus. This tends to be a double contrast exam (barium and two liquids which, when combined, create a gas to distend the wall of the oesophagus) or single contrast (non-ionic iodine based). The study is usually tailored to your symptoms so a good history is essential. Attention is paid during the study to the form, structure and configuration of the esophagus, looking for functional disorders (such as aspiration, dysphagia, achalasia, motility and reflux) EXAMINATION You may be asked to change into a gown, depending on the type of swallow being performed. A radiologist and radiographer will perform the procedure. The radiologist will advise you of the type of contrast selected for your procedure and direct you during the exam. You will be asked to stand, sit or lie in several different positions and to hold a small amount of fluid in your mouth before being asked to swallow while the imaging is performed .In some instances you may be asked to swallow barium coated marshmallows to assess the motility of a solid food bolus. The exam can be recorded as a digital or video fluoroscopy procedure. POST PROCEDURE It will take 1-2 days for the barium to pass through your system. To facilitate this, it is important, unless otherwise directed, to increase your fluids for the next 24-48hrs and to resume your normal diet.  This test typically takes about 30 minutes to  perform. __________________________________________________________________________________  Bonita QuinYou have been scheduled for an endoscopy. Please follow written instructions given to you at your visit today. If you use inhalers (even only as needed), please bring them with you on the day of your procedure. Your physician has requested that you go to www.startemmi.com and enter the access code given to you at your visit today. This web site gives a general overview about your procedure. However, you should still follow specific instructions given to you by our office regarding your preparation for the procedure.  You will be due for a recall colonoscopy in 08/2018. We will send you a reminder in the mail when it gets closer to that time.  Thank you for choosing me and [facility].  Venita LickMalcolm T. Pleas KochStark, Jr., MD., Clementeen GrahamFACG

## 2016-05-16 DIAGNOSIS — L218 Other seborrheic dermatitis: Secondary | ICD-10-CM | POA: Diagnosis not present

## 2016-06-02 ENCOUNTER — Ambulatory Visit (HOSPITAL_COMMUNITY)
Admission: RE | Admit: 2016-06-02 | Discharge: 2016-06-02 | Disposition: A | Discharge status: Home / Self Care | Payer: Commercial Managed Care - HMO | Source: Ambulatory Visit | Attending: Gastroenterology | Admitting: Gastroenterology

## 2016-06-02 DIAGNOSIS — R1310 Dysphagia, unspecified: Secondary | ICD-10-CM | POA: Diagnosis not present

## 2016-06-02 DIAGNOSIS — R1314 Dysphagia, pharyngoesophageal phase: Secondary | ICD-10-CM

## 2016-06-02 DIAGNOSIS — K222 Esophageal obstruction: Secondary | ICD-10-CM | POA: Insufficient documentation

## 2016-06-03 ENCOUNTER — Encounter: Payer: Self-pay | Admitting: Gastroenterology

## 2016-06-17 ENCOUNTER — Ambulatory Visit (AMBULATORY_SURGERY_CENTER): Payer: Commercial Managed Care - HMO | Admitting: Gastroenterology

## 2016-06-17 ENCOUNTER — Encounter: Payer: Self-pay | Admitting: Gastroenterology

## 2016-06-17 VITALS — BP 111/72 | HR 65 | Temp 95.7°F | Resp 18 | Ht 68.0 in | Wt 172.0 lb

## 2016-06-17 DIAGNOSIS — R1314 Dysphagia, pharyngoesophageal phase: Secondary | ICD-10-CM | POA: Diagnosis present

## 2016-06-17 DIAGNOSIS — K222 Esophageal obstruction: Secondary | ICD-10-CM

## 2016-06-17 DIAGNOSIS — E039 Hypothyroidism, unspecified: Secondary | ICD-10-CM | POA: Diagnosis not present

## 2016-06-17 DIAGNOSIS — R933 Abnormal findings on diagnostic imaging of other parts of digestive tract: Secondary | ICD-10-CM

## 2016-06-17 DIAGNOSIS — R1319 Other dysphagia: Secondary | ICD-10-CM

## 2016-06-17 DIAGNOSIS — R1310 Dysphagia, unspecified: Secondary | ICD-10-CM | POA: Diagnosis not present

## 2016-06-17 MED ORDER — SODIUM CHLORIDE 0.9 % IV SOLN: 500.0000 mL | INTRAVENOUS | Status: DC

## 2016-06-17 NOTE — Progress Notes (Signed)
Called to room to assist during endoscopic procedure.  Patient ID and intended procedure confirmed with present staff. Received instructions for my participation in the procedure from the performing physician.  

## 2016-06-17 NOTE — Op Note (Signed)
[facility] Patient Name: Cheryl Nash Procedure Date: 06/17/2016 9:53 AM MRN: [id number] Endoscopist: Meryl DareMalcolm T Abhishek Levesque , MD Age: 75 Referring MD:  Date of Birth: 1940-12-09 Gender: Female Account #: [id number] Procedure:                Upper GI endoscopy Indications:              Dysphagia, Abnormal esophagram Medicines:                Monitored Anesthesia Care Procedure:                Pre-Anesthesia Assessment:                           - Prior to the procedure, a History and Physical                            was performed, and patient medications and                            allergies were reviewed. The patient's tolerance of                            previous anesthesia was also reviewed. The risks                            and benefits of the procedure and the sedation                            options and risks were discussed with the patient.                            All questions were answered, and informed consent                            was obtained. Prior Anticoagulants: The patient has                            taken no previous anticoagulant or antiplatelet                            agents. ASA Grade Assessment: II - A patient with                            mild systemic disease. After reviewing the risks                            and benefits, the patient was deemed in                            satisfactory condition to undergo the procedure.                           After obtaining informed consent, the endoscope was  passed under direct vision. Throughout the                            procedure, the patient's blood pressure, pulse, and                            oxygen saturations were monitored continuously. The                            Model GIF-HQ190 [id number]) scope was introduced                            through the mouth, and advanced to the second part                            of duodenum. The  upper GI endoscopy was                            accomplished without difficulty. The patient                            tolerated the procedure well. Scope In: Scope Out: Findings:                 One mild benign-appearing, intrinsic stenosis was                            found at the gastroesophageal junction. This                            measured 1.1 cm (inner diameter) and was traversed.                            A guidewire was placed and the scope was withdrawn.                            Dilation was performed with a Savary dilator with                            mild resistance at 13 mm, 14 mm and 15 mm.                            Estimated blood loss: minimal.                           The exam of the esophagus was otherwise normal.                           The entire examined stomach was normal.                           The in the duodenum was normal.                           The cardia  and gastric fundus were normal on                            retroflexion. Complications:            No immediate complications. Estimated Blood Loss:     Estimated blood loss was minimal. Impression:               - Benign-appearing esophageal stenosis. Dilated.                           - Normal stomach.                           - Normal.                           - No specimens collected. Recommendation:           - Patient has a contact number available for                            emergencies. The signs and symptoms of potential                            delayed complications were discussed with the                            patient. Return to normal activities tomorrow.                            Written discharge instructions were provided to the                            patient.                           - Clear liquid diet for 2 hours then advance as                            tolerated to soft diet until tomorrow then resume                            prior diet.                            - Antireflux measures                           - Use Prilosec (omeprazole) 20 mg PO daily                            indefinitely.                           - Continue present medications. Meryl Dare, MD 06/17/2016 10:14:30 AM This report has been signed electronically.

## 2016-06-17 NOTE — Patient Instructions (Signed)
YOU HAD AN ENDOSCOPIC PROCEDURE TODAY AT THE [facility]:   Refer to the procedure report that was given to you for any specific questions about what was found during the examination.  If the procedure report does not answer your questions, please call your gastroenterologist to clarify.  If you requested that your care partner not be given the details of your procedure findings, then the procedure report has been included in a sealed envelope for you to review at your convenience later.  YOU SHOULD EXPECT: Some feelings of bloating in the abdomen. Passage of more gas than usual.  Walking can help get rid of the air that was put into your GI tract during the procedure and reduce the bloating.   Please Note:  You might notice some irritation and congestion in your nose or some drainage.  This is from the oxygen used during your procedure.  There is no need for concern and it should clear up in a day or so.  SYMPTOMS TO REPORT IMMEDIATELY:   Following upper endoscopy (EGD)  Vomiting of blood or coffee ground material  New chest pain or pain under the shoulder blades  Painful or persistently difficult swallowing  New shortness of breath  Fever of 100F or higher  Black, tarry-looking stools  For urgent or emergent issues, a gastroenterologist can be reached at any hour by calling [phone].   DIET:  We do recommend a small meal at first, but then you may proceed to your regular diet.  Drink plenty of fluids but you should avoid alcoholic beverages for 24 hours.  ACTIVITY:  You should plan to take it easy for the rest of today and you should NOT DRIVE or use heavy machinery until tomorrow (because of the sedation medicines used during the test).    FOLLOW UP: Our staff will call the number listed on your records the next business day following your procedure to check on you and address any questions or concerns that you may have regarding the information given to you following  your procedure. If we do not reach you, we will leave a message.  However, if you are feeling well and you are not experiencing any problems, there is no need to return our call.  We will assume that you have returned to your regular daily activities without incident.  If any biopsies were taken you will be contacted by phone or by letter within the next 1-3 weeks.  Please call us at [phone] if you have not heard about the biopsies in 3 weeks.    SIGNATURES/CONFIDENTIALITY: You and/or your care partner have signed paperwork which will be entered into your electronic medical record.  These signatures attest to the fact that that the information above on your After Visit Summary has been reviewed and is understood.  Full responsibility of the confidentiality of this discharge information lies with you and/or your care-partner.YOU HAD AN ENDOSCOPIC PROCEDURE TODAY AT THE [facility]:   Refer to the procedure report that was given to you for any specific questions about what was found during the examination.  If the procedure report does not answer your questions, please call your gastroenterologist to clarify.  If you requested that your care partner not be given the details of your procedure findings, then the procedure report has been included in a sealed envelope for you to review at your convenience later.  YOU SHOULD EXPECT: Some feelings of bloating in the abdomen. Passage of more  gas than usual.  Walking can help get rid of the air that was put into your GI tract during the procedure and reduce the bloating. If you had a lower endoscopy (such as a colonoscopy or flexible sigmoidoscopy) you may notice spotting of blood in your stool or on the toilet paper. If you underwent a bowel prep for your procedure, you may not have a normal bowel movement for a few days.  Please Note:  You might notice some irritation and congestion in your nose or some drainage.  This is from the oxygen used  during your procedure.  There is no need for concern and it should clear up in a day or so.  SYMPTOMS TO REPORT IMMEDIATELY:    Following upper endoscopy (EGD)  Vomiting of blood or coffee ground material  New chest pain or pain under the shoulder blades  Painful or persistently difficult swallowing  New shortness of breath  Fever of 100F or higher  Black, tarry-looking stools  For urgent or emergent issues, a gastroenterologist can be reached at any hour by calling [phone].   DIET:  Clear liquid diet for the next 2 hours then soft diet for the rest of today. Tomorrow, you may have a regular diet.  Drink plenty of fluids but you should avoid alcoholic beverages for 24 hours.  ACTIVITY:  You should plan to take it easy for the rest of today and you should NOT DRIVE or use heavy machinery until tomorrow (because of the sedation medicines used during the test).    FOLLOW UP: Our staff will call the number listed on your records the next business day following your procedure to check on you and address any questions or concerns that you may have regarding the information given to you following your procedure. If we do not reach you, we will leave a message.  However, if you are feeling well and you are not experiencing any problems, there is no need to return our call.  We will assume that you have returned to your regular daily activities without incident.  If any biopsies were taken you will be contacted by phone or by letter within the next 1-3 weeks.  Please call us at [phone] if you have not heard about the biopsies in 3 weeks.    SIGNATURES/CONFIDENTIALITY: You and/or your care partner have signed paperwork which will be entered into your electronic medical record.  These signatures attest to the fact that that the information above on your After Visit Summary has been reviewed and is understood.  Full responsibility of the confidentiality of this discharge information  lies with you and/or your care-partner.  Use omeprazole 20mg  1/2 hour before breakfast every day per Dr. Russella DarStark.

## 2016-06-17 NOTE — Progress Notes (Signed)
Report to PACU, RN, vss, BBS= Clear.  

## 2016-06-18 ENCOUNTER — Telehealth: Payer: Self-pay

## 2016-06-18 NOTE — Telephone Encounter (Signed)
  Follow up Call-  Call back number 06/17/2016  Post procedure Call Back phone  # [phone]  Permission to leave phone message Yes  Some recent data might be hidden     Patient questions:  Do you have a fever, pain , or abdominal swelling? No. Pain Score  0 *  Have you tolerated food without any problems? Yes.    Have you been able to return to your normal activities? Yes.    Do you have any questions about your discharge instructions: Diet   No. Medications  No. Follow up visit  No.  Do you have questions or concerns about your Care? No.  Actions: * If pain score is 4 or above: No action needed, pain <4.  Pt express she received excellent care and asked if I could send her a survey to complete.  I explained that the surveys come randomly, but that if she wanted to write a note to [facility] she could.  Pt said she would be happy to write a note about her experience. maw

## 2016-07-14 DIAGNOSIS — Z23 Encounter for immunization: Secondary | ICD-10-CM | POA: Diagnosis not present

## 2016-07-23 DIAGNOSIS — M1611 Unilateral primary osteoarthritis, right hip: Secondary | ICD-10-CM | POA: Diagnosis not present

## 2016-07-29 DIAGNOSIS — M1611 Unilateral primary osteoarthritis, right hip: Secondary | ICD-10-CM | POA: Diagnosis not present

## 2016-09-03 DIAGNOSIS — K006 Disturbances in tooth eruption: Secondary | ICD-10-CM | POA: Diagnosis not present

## 2016-11-10 DIAGNOSIS — M1612 Unilateral primary osteoarthritis, left hip: Secondary | ICD-10-CM | POA: Diagnosis not present

## 2016-11-11 ENCOUNTER — Inpatient Hospital Stay: Admit: 2016-11-11 | Payer: Commercial Managed Care - HMO | Admitting: Orthopaedic Surgery

## 2016-11-11 SURGERY — ARTHROPLASTY, HIP, TOTAL, ANTERIOR APPROACH
Anesthesia: Spinal | Laterality: Left

## 2016-12-09 ENCOUNTER — Other Ambulatory Visit: Payer: Self-pay | Admitting: Orthopaedic Surgery

## 2016-12-16 ENCOUNTER — Ambulatory Visit (HOSPITAL_COMMUNITY)
Admission: RE | Admit: 2016-12-16 | Discharge: 2016-12-16 | Disposition: A | Discharge status: Home / Self Care | Payer: Medicare HMO | Source: Ambulatory Visit | Attending: Orthopaedic Surgery | Admitting: Orthopaedic Surgery

## 2016-12-16 ENCOUNTER — Encounter (HOSPITAL_COMMUNITY): Payer: Self-pay

## 2016-12-16 ENCOUNTER — Encounter (HOSPITAL_COMMUNITY)
Admission: RE | Admit: 2016-12-16 | Discharge: 2016-12-16 | Disposition: A | Discharge status: Home / Self Care | Payer: Medicare HMO | Source: Ambulatory Visit | Attending: Orthopaedic Surgery | Admitting: Orthopaedic Surgery

## 2016-12-16 DIAGNOSIS — Z01812 Encounter for preprocedural laboratory examination: Secondary | ICD-10-CM | POA: Diagnosis not present

## 2016-12-16 DIAGNOSIS — I498 Other specified cardiac arrhythmias: Secondary | ICD-10-CM | POA: Diagnosis not present

## 2016-12-16 DIAGNOSIS — Z01818 Encounter for other preprocedural examination: Secondary | ICD-10-CM | POA: Diagnosis not present

## 2016-12-16 DIAGNOSIS — Z01810 Encounter for preprocedural cardiovascular examination: Secondary | ICD-10-CM | POA: Diagnosis not present

## 2016-12-16 DIAGNOSIS — I517 Cardiomegaly: Secondary | ICD-10-CM | POA: Diagnosis not present

## 2016-12-16 DIAGNOSIS — M1612 Unilateral primary osteoarthritis, left hip: Secondary | ICD-10-CM | POA: Insufficient documentation

## 2016-12-16 HISTORY — DX: Hypoglycemia, unspecified: E16.2

## 2016-12-16 HISTORY — DX: Inflammatory liver disease, unspecified: K75.9

## 2016-12-16 LAB — PROTIME-INR
INR: 0.97
Prothrombin Time: 12.9 seconds (ref 11.4–15.2)

## 2016-12-16 LAB — URINALYSIS, ROUTINE W REFLEX MICROSCOPIC
BILIRUBIN URINE: NEGATIVE
Glucose, UA: NEGATIVE mg/dL
HGB URINE DIPSTICK: NEGATIVE
KETONES UR: NEGATIVE mg/dL
Leukocytes, UA: NEGATIVE
NITRITE: NEGATIVE
Protein, ur: NEGATIVE mg/dL
SPECIFIC GRAVITY, URINE: 1.005 (ref 1.005–1.030)
pH: 7 (ref 5.0–8.0)

## 2016-12-16 LAB — CBC WITH DIFFERENTIAL/PLATELET
BASOS ABS: 0 10*3/uL (ref 0.0–0.1)
BASOS PCT: 0 %
EOS PCT: 3 %
Eosinophils Absolute: 0.2 10*3/uL (ref 0.0–0.7)
HEMATOCRIT: 40.8 % (ref 36.0–46.0)
Hemoglobin: 14.1 g/dL (ref 12.0–15.0)
LYMPHS PCT: 34 %
Lymphs Abs: 1.9 10*3/uL (ref 0.7–4.0)
MCH: 32 pg (ref 26.0–34.0)
MCHC: 34.6 g/dL (ref 30.0–36.0)
MCV: 92.5 fL (ref 78.0–100.0)
MONO ABS: 0.5 10*3/uL (ref 0.1–1.0)
MONOS PCT: 9 %
Neutro Abs: 3.1 10*3/uL (ref 1.7–7.7)
Neutrophils Relative %: 54 %
PLATELETS: 257 10*3/uL (ref 150–400)
RBC: 4.41 MIL/uL (ref 3.87–5.11)
RDW: 12 % (ref 11.5–15.5)
WBC: 5.6 10*3/uL (ref 4.0–10.5)

## 2016-12-16 LAB — BASIC METABOLIC PANEL
Anion gap: 6 (ref 5–15)
BUN: 10 mg/dL (ref 6–20)
CALCIUM: 9.6 mg/dL (ref 8.9–10.3)
CO2: 26 mmol/L (ref 22–32)
Chloride: 105 mmol/L (ref 101–111)
Creatinine, Ser: 0.78 mg/dL (ref 0.44–1.00)
GFR calc Af Amer: >60 mL/min (ref >60)
GLUCOSE: 94 mg/dL (ref 65–99)
Potassium: 4.2 mmol/L (ref 3.5–5.1)
Sodium: 137 mmol/L (ref 135–145)

## 2016-12-16 LAB — SURGICAL PCR SCREEN
MRSA, PCR: NEGATIVE
STAPHYLOCOCCUS AUREUS: NEGATIVE

## 2016-12-16 LAB — APTT: APTT: 35 s (ref 24–36)

## 2016-12-16 NOTE — Pre-Procedure Instructions (Signed)
Cheryl Nash  12/16/2016      [id number] - [place], [place] - [address] [place] [id number] Phone: [phone] Fax: [phone]    Your procedure is scheduled on   Tuesday  12/23/16  [facility] at 530 A.M.  Call this number if you have problems the morning of surgery:  [phone]   Remember:  Do not eat food or drink liquids after midnight.  Take these medicines the morning of surgery with A SIP OF WATER  Levothyroxine, liothyronine (cytomel)  (stop  7 DAYS PRIOR TO SURGERY ASPIRIN OR ASPIRIN PRODUCTS, IBUPROFEN/ ADVIL/ MOTRIN/ GOODY POWDERS, BC'S, MULTIVITAMIN, OMEGA3 FISH OIL, HERBAL MEDICINES)   Do not wear jewelry, make-up or nail polish.  Do not wear lotions, powders, or perfumes, or deoderant.  Do not shave 48 hours prior to surgery.  Men may shave face and neck.  Do not bring valuables to the hospital.  [facility] is not responsible for any belongings or valuables.  Contacts, dentures or bridgework may not be worn into surgery.  Leave your suitcase in the car.  After surgery it may be brought to your room.  For patients admitted to the hospital, discharge time will be determined by your treatment team.  Patients discharged the day of surgery will not be allowed to drive home.   Name and phone number of your driver:    Special instructions:  [facility] - Preparing for Surgery  Before surgery, you can play an important role.  Because skin is not sterile, your skin needs to be as free of germs as possible.  You can reduce the number of germs on you skin by washing with CHG (chlorahexidine gluconate) soap before surgery.  CHG is an antiseptic cleaner which kills germs and bonds with the skin to continue killing germs even after washing.  Please DO NOT use if you have an allergy to CHG or antibacterial soaps.  If your skin becomes reddened/irritated stop using  the CHG and inform your nurse when you arrive at Short Stay.  Do not shave (including legs and underarms) for at least 48 hours prior to the first CHG shower.  You may shave your face.  Please follow these instructions carefully:   1.  Shower with CHG Soap the night before surgery and the                                morning of Surgery.  2.  If you choose to wash your hair, wash your hair first as usual with your       normal shampoo.  3.  After you shampoo, rinse your hair and body thoroughly to remove the                      Shampoo.  4.  Use CHG as you would any other liquid soap.  You can apply chg directly       to the skin and wash gently with scrungie or a clean washcloth.  5.  Apply the CHG Soap to your body ONLY FROM THE NECK DOWN.        Do not use on open wounds or open sores.  Avoid contact with your eyes,       ears, mouth and genitals (private parts).  Wash genitals (private parts)  with your normal soap.  6.  Wash thoroughly, paying special attention to the area where your surgery        will be performed.  7.  Thoroughly rinse your body with warm water from the neck down.  8.  DO NOT shower/wash with your normal soap after using and rinsing off       the CHG Soap.  9.  Pat yourself dry with a clean towel.            10.  Wear clean pajamas.            11.  Place clean sheets on your bed the night of your first shower and do not        sleep with pets.  Day of Surgery  Do not apply any lotions/deoderants the morning of surgery.  Please wear clean clothes to the hospital/surgery center.    Please read over the following fact sheets that you were given. Pain Booklet, Coughing and Deep Breathing, Blood Transfusion Information, MRSA Information and Surgical Site Infection Prevention

## 2016-12-19 NOTE — H&P (Signed)
TOTAL HIP ADMISSION H&P  Patient is admitted for left total hip arthroplasty.  Subjective:  Chief Complaint: left hip pain  HPI: Cheryl Nash, 76 y.o. female, has a history of pain and functional disability in the left hip(s) due to arthritis and patient has failed non-surgical conservative treatments for greater than 12 weeks to include NSAID's and/or analgesics, corticosteriod injections, flexibility and strengthening excercises, use of assistive devices, weight reduction as appropriate and activity modification.  Onset of symptoms was gradual starting 5 years ago with gradually worsening course since that time.The patient noted no past surgery on the left hip(s).  Patient currently rates pain in the left hip at 10 out of 10 with activity. Patient has night pain, worsening of pain with activity and weight bearing, trendelenberg gait, pain that interfers with activities of daily living, pain with passive range of motion and crepitus. Patient has evidence of subchondral cysts, subchondral sclerosis, periarticular osteophytes and joint space narrowing by imaging studies. This condition presents safety issues increasing the risk of falls. There is no current active infection.  There are no active problems to display for this patient.  Past Medical History:  Diagnosis Date  . Allergic rhinitis   . Chronic constipation   . Hepatitis    2003   TOXIC HEPATITIS  . Histoplasmosis 1972  . Hyperlipidemia   . Hypoglycemia   . Hypothyroidism   . MVP (mitral valve prolapse)   . Osteoporosis   . Pancreatitis 2001  . Pneumonia [phone]  . Psoriasis     Past Surgical History:  Procedure Laterality Date  . ABDOMINAL HYSTERECTOMY    . APPENDECTOMY    . CATARACT EXTRACTION Bilateral 11/2015,12/2015  . CHOLECYSTECTOMY    . ESOPHAGEAL DILATION    . KNEE ARTHROSCOPY    . LIVER RESECTION    . LUMBAR DISC SURGERY  05/2000   L4-5  . LYSIS OF ADHESION  2001  . pancreatic duct  1985   enlargement  of duct  . TONSILLECTOMY AND ADENOIDECTOMY  1948  . TOTAL ABDOMINAL HYSTERECTOMY W/ BILATERAL SALPINGOOPHORECTOMY  1970    No prescriptions prior to admission.   Allergies  Allergen Reactions  . Macrodantin [Nitrofurantoin Macrocrystal] Anaphylaxis  . Amoxicillin     . Fatigue, agitation and depression  . Augmentin [Amoxicillin-Pot Clavulanate] Itching    Insomnia   . Celebrex [Celecoxib] Itching  . Cephalexin Other (See Comments)    dermatitis  . Ciprofloxacin Other (See Comments)    dermatitis  . Clindamycin/Lincomycin Nausea And Vomiting  . Demerol [Meperidine] Other (See Comments)    INVOLUNTARY TWITCHING Involuntary twitching  . Ketek [Telithromycin] Other (See Comments)    Unknown   . Levaquin [Levofloxacin In D5w] Other (See Comments)    Toxic hepatitis  . Mometasone Other (See Comments)    RED, ITCHY, SWOLLEN EYE LIDS  . Premarin [Conjugated Estrogens]     VAGINAL CREAM-  HEADACHE, NAUSEA, VAGINAL BURNING  . Prilosec [Omeprazole] Other (See Comments)    Abdominal pain   . Quinolones        HX TOXIC HEPATITIS  . Septra [Sulfamethoxazole-Trimethoprim] Other (See Comments)    Headache, insomnia  . Vioxx [Rofecoxib] Itching and Other (See Comments)    ITCHING, SEVERE LARYNGITIS  . Aspartame And Phenylalanine Rash  . Codeine Sulfate Itching and Rash  . Etodolac Itching and Rash    whelps    Social History  Substance Use Topics  . Smoking status: Never Smoker  . Smokeless tobacco: Never Used  .  Alcohol use No    Family History  Problem Relation Age of Onset  . Heart attack Mother   . CAD Mother   . Colon cancer Maternal Grandfather   . Aneurysm Maternal Grandmother      Review of Systems  Musculoskeletal: Positive for joint pain.       Left hip  All other systems reviewed and are negative.   Objective:  Physical Exam  Constitutional: She is oriented to person, place, and time. She appears well-developed and well-nourished.  HENT:  Head:  Normocephalic and atraumatic.  Eyes: Pupils are equal, round, and reactive to light.  Neck: Normal range of motion.  Cardiovascular: Normal rate and regular rhythm.   Respiratory: Effort normal.  GI: Soft.  Musculoskeletal:  Left hip remains very painful to internal rotation. Her leg lengths are equal. She walks with an altered gait. Sensation and motor function are intact in her feet with palpable pulses on both sides. There is no palpable lymphadenopathy at the groin.   Neurological: She is alert and oriented to person, place, and time.  Skin: Skin is warm and dry.  Psychiatric: She has a normal mood and affect. Her behavior is normal. Judgment and thought content normal.    Vital signs in last 24 hours:    Labs:   Estimated body mass index is 25.73 kg/m as calculated from the following:   Height as of 12/16/16: 5' 8.5" (1.74 m).   Weight as of 12/16/16: 77.9 kg (171 lb 11.2 oz).   Imaging Review Plain radiographs demonstrate severe degenerative joint disease of the left hip(s). The bone quality appears to be good for age and reported activity level.  Assessment/Plan:  End stage primary arthritis, left hip(s)  The patient history, physical examination, clinical judgement of the provider and imaging studies are consistent with end stage degenerative joint disease of the left hip(s) and total hip arthroplasty is deemed medically necessary. The treatment options including medical management, injection therapy, arthroscopy and arthroplasty were discussed at length. The risks and benefits of total hip arthroplasty were presented and reviewed. The risks due to aseptic loosening, infection, stiffness, dislocation/subluxation,  thromboembolic complications and other imponderables were discussed.  The patient acknowledged the explanation, agreed to proceed with the plan and consent was signed. Patient is being admitted for inpatient treatment for surgery, pain control, PT, OT, prophylactic  antibiotics, VTE prophylaxis, progressive ambulation and ADL's and discharge planning.The patient is planning to be discharged home with home health services

## 2016-12-22 NOTE — Anesthesia Preprocedure Evaluation (Signed)
Anesthesia Evaluation  Patient identified by MRN, date of birth, ID band Patient awake    Reviewed: Allergy & Precautions, NPO status , Patient's Chart, lab work & pertinent test results  Airway Mallampati: II  TM Distance: >3 FB Neck ROM: Full    Dental no notable dental hx.    Pulmonary pneumonia,    Pulmonary exam normal breath sounds clear to auscultation       Cardiovascular negative cardio ROS Normal cardiovascular exam+ Valvular Problems/Murmurs MVP  Rhythm:Regular Rate:Normal     Neuro/Psych negative neurological ROS  negative psych ROS   GI/Hepatic negative GI ROS, Neg liver ROS,   Endo/Other  Hypothyroidism   Renal/GU negative Renal ROS     Musculoskeletal negative musculoskeletal ROS (+)   Abdominal   Peds  Hematology negative hematology ROS (+)   Anesthesia Other Findings   Reproductive/Obstetrics negative OB ROS                             Anesthesia Physical Anesthesia Plan  ASA: II  Anesthesia Plan: Spinal   Post-op Pain Management:    Induction:   Airway Management Planned:   Additional Equipment:   Intra-op Plan:   Post-operative Plan:   Informed Consent: I have reviewed the patients History and Physical, chart, labs and discussed the procedure including the risks, benefits and alternatives for the proposed anesthesia with the patient or authorized representative who has indicated his/her understanding and acceptance.   Dental advisory given  Plan Discussed with: CRNA  Anesthesia Plan Comments:         Anesthesia Quick Evaluation

## 2016-12-23 ENCOUNTER — Encounter (HOSPITAL_COMMUNITY): Payer: Self-pay | Admitting: Certified Registered Nurse Anesthetist

## 2016-12-23 ENCOUNTER — Inpatient Hospital Stay (HOSPITAL_COMMUNITY): Payer: Medicare HMO | Admitting: Certified Registered Nurse Anesthetist

## 2016-12-23 ENCOUNTER — Inpatient Hospital Stay (HOSPITAL_COMMUNITY): Payer: Medicare HMO

## 2016-12-23 ENCOUNTER — Encounter (HOSPITAL_COMMUNITY)
Admission: RE | Disposition: A | Discharge status: Home Health | Payer: Self-pay | Source: Ambulatory Visit | Attending: Orthopaedic Surgery

## 2016-12-23 ENCOUNTER — Inpatient Hospital Stay (HOSPITAL_COMMUNITY)
Admission: RE | Admit: 2016-12-23 | Discharge: 2016-12-24 | DRG: 470 | Disposition: A | Discharge status: Home Health | Payer: Medicare HMO | Source: Ambulatory Visit | Attending: Orthopaedic Surgery | Admitting: Orthopaedic Surgery

## 2016-12-23 DIAGNOSIS — E039 Hypothyroidism, unspecified: Secondary | ICD-10-CM | POA: Diagnosis present

## 2016-12-23 DIAGNOSIS — Z419 Encounter for procedure for purposes other than remedying health state, unspecified: Secondary | ICD-10-CM

## 2016-12-23 DIAGNOSIS — Z90710 Acquired absence of both cervix and uterus: Secondary | ICD-10-CM

## 2016-12-23 DIAGNOSIS — E785 Hyperlipidemia, unspecified: Secondary | ICD-10-CM | POA: Diagnosis present

## 2016-12-23 DIAGNOSIS — Z96642 Presence of left artificial hip joint: Secondary | ICD-10-CM | POA: Diagnosis not present

## 2016-12-23 DIAGNOSIS — Z9049 Acquired absence of other specified parts of digestive tract: Secondary | ICD-10-CM | POA: Diagnosis not present

## 2016-12-23 DIAGNOSIS — Z471 Aftercare following joint replacement surgery: Secondary | ICD-10-CM | POA: Diagnosis not present

## 2016-12-23 DIAGNOSIS — Z7982 Long term (current) use of aspirin: Secondary | ICD-10-CM | POA: Diagnosis not present

## 2016-12-23 DIAGNOSIS — Z8249 Family history of ischemic heart disease and other diseases of the circulatory system: Secondary | ICD-10-CM

## 2016-12-23 DIAGNOSIS — Z79899 Other long term (current) drug therapy: Secondary | ICD-10-CM

## 2016-12-23 DIAGNOSIS — M25552 Pain in left hip: Secondary | ICD-10-CM | POA: Diagnosis present

## 2016-12-23 DIAGNOSIS — M810 Age-related osteoporosis without current pathological fracture: Secondary | ICD-10-CM | POA: Diagnosis present

## 2016-12-23 DIAGNOSIS — Z881 Allergy status to other antibiotic agents status: Secondary | ICD-10-CM | POA: Diagnosis not present

## 2016-12-23 DIAGNOSIS — M1612 Unilateral primary osteoarthritis, left hip: Principal | ICD-10-CM | POA: Diagnosis present

## 2016-12-23 HISTORY — PX: TOTAL HIP ARTHROPLASTY: SHX124

## 2016-12-23 LAB — TYPE AND SCREEN
ABO/RH(D): O POS
Antibody Screen: POSITIVE

## 2016-12-23 SURGERY — ARTHROPLASTY, HIP, TOTAL, ANTERIOR APPROACH
Anesthesia: General | Site: Hip | Laterality: Left

## 2016-12-23 MED ORDER — ONDANSETRON HCL 4 MG/2ML IJ SOLN
INTRAMUSCULAR | Status: AC
  Filled 2016-12-23: qty 2

## 2016-12-23 MED ORDER — LIDOCAINE 2% (20 MG/ML) 5 ML SYRINGE
INTRAMUSCULAR | Status: DC | PRN
  Administered 2016-12-23: 20 mg via INTRAVENOUS

## 2016-12-23 MED ORDER — NITROGLYCERIN 0.4 MG SL SUBL: 0.4000 mg | SUBLINGUAL_TABLET | SUBLINGUAL | Status: DC | PRN

## 2016-12-23 MED ORDER — METOCLOPRAMIDE HCL 5 MG PO TABS: 5.0000 mg | ORAL_TABLET | Freq: Three times a day (TID) | ORAL | Status: DC | PRN

## 2016-12-23 MED ORDER — ALUM & MAG HYDROXIDE-SIMETH 200-200-20 MG/5ML PO SUSP: 30.0000 mL | ORAL | Status: DC | PRN

## 2016-12-23 MED ORDER — METHOCARBAMOL 1000 MG/10ML IJ SOLN
500.0000 mg | Freq: Four times a day (QID) | INTRAVENOUS | Status: DC | PRN
  Filled 2016-12-23: qty 5

## 2016-12-23 MED ORDER — EPHEDRINE SULFATE-NACL 50-0.9 MG/10ML-% IV SOSY
PREFILLED_SYRINGE | INTRAVENOUS | Status: DC | PRN
  Administered 2016-12-23 (×2): 5 mg via INTRAVENOUS

## 2016-12-23 MED ORDER — ACETAMINOPHEN 325 MG PO TABS: 650.0000 mg | ORAL_TABLET | Freq: Four times a day (QID) | ORAL | Status: DC | PRN

## 2016-12-23 MED ORDER — LEVOTHYROXINE SODIUM 112 MCG PO TABS
112.0000 ug | ORAL_TABLET | Freq: Every day | ORAL | Status: DC
  Filled 2016-12-23: qty 1

## 2016-12-23 MED ORDER — BISACODYL 5 MG PO TBEC: 5.0000 mg | DELAYED_RELEASE_TABLET | Freq: Every day | ORAL | Status: DC | PRN

## 2016-12-23 MED ORDER — DIPHENHYDRAMINE HCL 12.5 MG/5ML PO ELIX
12.5000 mg | ORAL_SOLUTION | ORAL | Status: DC | PRN
  Administered 2016-12-24: 12.5 mg via ORAL
  Filled 2016-12-23: qty 10

## 2016-12-23 MED ORDER — ASPIRIN EC 325 MG PO TBEC
325.0000 mg | DELAYED_RELEASE_TABLET | Freq: Two times a day (BID) | ORAL | Status: DC
  Administered 2016-12-24: 325 mg via ORAL
  Filled 2016-12-23: qty 1

## 2016-12-23 MED ORDER — LIDOCAINE 2% (20 MG/ML) 5 ML SYRINGE
INTRAMUSCULAR | Status: AC
  Filled 2016-12-23: qty 5

## 2016-12-23 MED ORDER — HYDROCODONE-ACETAMINOPHEN 5-325 MG PO TABS
1.0000 | ORAL_TABLET | ORAL | Status: DC | PRN
  Administered 2016-12-23 – 2016-12-24 (×4): 2 via ORAL
  Filled 2016-12-23 (×5): qty 2

## 2016-12-23 MED ORDER — BUPIVACAINE-EPINEPHRINE (PF) 0.5% -1:200000 IJ SOLN
INTRAMUSCULAR | Status: DC | PRN
  Administered 2016-12-23: 20 mL via PERINEURAL

## 2016-12-23 MED ORDER — BUPIVACAINE HCL (PF) 0.5 % IJ SOLN
INTRAMUSCULAR | Status: DC | PRN
  Administered 2016-12-23: 3 mL via INTRATHECAL

## 2016-12-23 MED ORDER — PROPOFOL 10 MG/ML IV BOLUS
INTRAVENOUS | Status: AC
  Filled 2016-12-23: qty 40

## 2016-12-23 MED ORDER — CHLORHEXIDINE GLUCONATE 4 % EX LIQD: 60.0000 mL | Freq: Once | CUTANEOUS | Status: DC

## 2016-12-23 MED ORDER — ONDANSETRON HCL 4 MG/2ML IJ SOLN
4.0000 mg | Freq: Four times a day (QID) | INTRAMUSCULAR | Status: DC | PRN
  Administered 2016-12-23: 4 mg via INTRAVENOUS
  Filled 2016-12-23: qty 2

## 2016-12-23 MED ORDER — TRANEXAMIC ACID 1000 MG/10ML IV SOLN
1000.0000 mg | Freq: Once | INTRAVENOUS | Status: AC
  Administered 2016-12-23: 1000 mg via INTRAVENOUS
  Filled 2016-12-23: qty 10

## 2016-12-23 MED ORDER — EPHEDRINE 5 MG/ML INJ
INTRAVENOUS | Status: AC
  Filled 2016-12-23: qty 10

## 2016-12-23 MED ORDER — DOCUSATE SODIUM 100 MG PO CAPS
100.0000 mg | ORAL_CAPSULE | Freq: Two times a day (BID) | ORAL | Status: DC
  Administered 2016-12-24: 100 mg via ORAL
  Filled 2016-12-23: qty 1

## 2016-12-23 MED ORDER — FENTANYL CITRATE (PF) 100 MCG/2ML IJ SOLN
INTRAMUSCULAR | Status: AC
  Filled 2016-12-23: qty 2

## 2016-12-23 MED ORDER — ONDANSETRON HCL 4 MG/2ML IJ SOLN
INTRAMUSCULAR | Status: DC | PRN
  Administered 2016-12-23: 4 mg via INTRAVENOUS

## 2016-12-23 MED ORDER — LACTATED RINGERS IV SOLN
INTRAVENOUS | Status: DC
  Administered 2016-12-23 (×2): via INTRAVENOUS

## 2016-12-23 MED ORDER — METHOCARBAMOL 500 MG PO TABS
500.0000 mg | ORAL_TABLET | Freq: Four times a day (QID) | ORAL | Status: DC | PRN
  Administered 2016-12-23: 500 mg via ORAL
  Filled 2016-12-23: qty 1

## 2016-12-23 MED ORDER — PROPOFOL 10 MG/ML IV BOLUS
INTRAVENOUS | Status: DC | PRN
  Administered 2016-12-23 (×2): 100 mg via INTRAVENOUS
  Administered 2016-12-23: 20 mg via INTRAVENOUS

## 2016-12-23 MED ORDER — VANCOMYCIN HCL IN DEXTROSE 1-5 GM/200ML-% IV SOLN
1000.0000 mg | Freq: Two times a day (BID) | INTRAVENOUS | Status: AC
  Administered 2016-12-23: 1000 mg via INTRAVENOUS
  Filled 2016-12-23: qty 200

## 2016-12-23 MED ORDER — PROMETHAZINE HCL 25 MG/ML IJ SOLN: 6.2500 mg | INTRAMUSCULAR | Status: DC | PRN

## 2016-12-23 MED ORDER — BUPIVACAINE LIPOSOME 1.3 % IJ SUSP
20.0000 mL | INTRAMUSCULAR | Status: AC
  Administered 2016-12-23: 20 mL
  Filled 2016-12-23: qty 20

## 2016-12-23 MED ORDER — HYDROMORPHONE HCL 1 MG/ML IJ SOLN: 0.2500 mg | INTRAMUSCULAR | Status: DC | PRN

## 2016-12-23 MED ORDER — TRANEXAMIC ACID 1000 MG/10ML IV SOLN
2000.0000 mg | INTRAVENOUS | Status: AC
  Administered 2016-12-23: 2000 mg via TOPICAL
  Filled 2016-12-23: qty 20

## 2016-12-23 MED ORDER — METOCLOPRAMIDE HCL 5 MG/ML IJ SOLN
5.0000 mg | Freq: Three times a day (TID) | INTRAMUSCULAR | Status: DC | PRN
  Administered 2016-12-24: 10 mg via INTRAVENOUS
  Filled 2016-12-23: qty 2

## 2016-12-23 MED ORDER — PHENYLEPHRINE HCL 10 MG/ML IJ SOLN
INTRAVENOUS | Status: DC | PRN
  Administered 2016-12-23: 25 ug/min via INTRAVENOUS

## 2016-12-23 MED ORDER — BUPIVACAINE-EPINEPHRINE (PF) 0.5% -1:200000 IJ SOLN
INTRAMUSCULAR | Status: AC
  Filled 2016-12-23: qty 30

## 2016-12-23 MED ORDER — ONDANSETRON HCL 4 MG PO TABS: 4.0000 mg | ORAL_TABLET | Freq: Four times a day (QID) | ORAL | Status: DC | PRN

## 2016-12-23 MED ORDER — MENTHOL 3 MG MT LOZG: 1.0000 | LOZENGE | OROMUCOSAL | Status: DC | PRN

## 2016-12-23 MED ORDER — MIDAZOLAM HCL 2 MG/2ML IJ SOLN
INTRAMUSCULAR | Status: AC
  Filled 2016-12-23: qty 2

## 2016-12-23 MED ORDER — LIOTHYRONINE SODIUM 5 MCG PO TABS
5.0000 ug | ORAL_TABLET | Freq: Every day | ORAL | Status: DC
  Filled 2016-12-23: qty 1

## 2016-12-23 MED ORDER — LACTATED RINGERS IV SOLN
INTRAVENOUS | Status: DC
  Administered 2016-12-23: 14:00:00 via INTRAVENOUS

## 2016-12-23 MED ORDER — PROPOFOL 500 MG/50ML IV EMUL
INTRAVENOUS | Status: DC | PRN
  Administered 2016-12-23: 50 ug/kg/min via INTRAVENOUS

## 2016-12-23 MED ORDER — VANCOMYCIN HCL IN DEXTROSE 1-5 GM/200ML-% IV SOLN
1000.0000 mg | INTRAVENOUS | Status: AC
  Administered 2016-12-23: 1000 mg via INTRAVENOUS
  Filled 2016-12-23: qty 200

## 2016-12-23 MED ORDER — FENTANYL CITRATE (PF) 100 MCG/2ML IJ SOLN
INTRAMUSCULAR | Status: DC | PRN
  Administered 2016-12-23 (×2): 25 ug via INTRAVENOUS
  Administered 2016-12-23 (×2): 50 ug via INTRAVENOUS

## 2016-12-23 MED ORDER — HYDROMORPHONE HCL 2 MG/ML IJ SOLN
0.5000 mg | INTRAMUSCULAR | Status: DC | PRN
  Administered 2016-12-23 (×2): 1 mg via INTRAVENOUS
  Filled 2016-12-23 (×2): qty 1

## 2016-12-23 MED ORDER — ACETAMINOPHEN 650 MG RE SUPP: 650.0000 mg | Freq: Four times a day (QID) | RECTAL | Status: DC | PRN

## 2016-12-23 MED ORDER — PHENOL 1.4 % MT LIQD: 1.0000 | OROMUCOSAL | Status: DC | PRN

## 2016-12-23 MED ORDER — 0.9 % SODIUM CHLORIDE (POUR BTL) OPTIME
TOPICAL | Status: DC | PRN
  Administered 2016-12-23: 1000 mL

## 2016-12-23 MED ORDER — TRANEXAMIC ACID 1000 MG/10ML IV SOLN
1000.0000 mg | INTRAVENOUS | Status: AC
  Administered 2016-12-23: 1000 mg via INTRAVENOUS
  Filled 2016-12-23: qty 10

## 2016-12-23 SURGICAL SUPPLY — 47 items
BAG DECANTER FOR FLEXI CONT (MISCELLANEOUS) ×3 IMPLANT
BLADE SAW SGTL 18X1.27X75 (BLADE) ×2 IMPLANT
BLADE SAW SGTL 18X1.27X75MM (BLADE) ×1
CAPT HIP TOTAL 2 ×3 IMPLANT
CELLS DAT CNTRL 66122 CELL SVR (MISCELLANEOUS) ×1 IMPLANT
CLOSURE WOUND 1/2 X4 (GAUZE/BANDAGES/DRESSINGS) ×1
COVER PERINEAL POST (MISCELLANEOUS) ×3 IMPLANT
COVER SURGICAL LIGHT HANDLE (MISCELLANEOUS) ×3 IMPLANT
DRAPE C-ARM 42X72 X-RAY (DRAPES) ×3 IMPLANT
DRAPE STERI IOBAN 125X83 (DRAPES) ×3 IMPLANT
DRAPE U-SHAPE 47X51 STRL (DRAPES) ×9 IMPLANT
DRSG AQUACEL AG ADV 3.5X10 (GAUZE/BANDAGES/DRESSINGS) ×3 IMPLANT
DURAPREP 26ML APPLICATOR (WOUND CARE) ×3 IMPLANT
ELECT BLADE 4.0 EZ CLEAN MEGAD (MISCELLANEOUS)
ELECT REM PT RETURN 9FT ADLT (ELECTROSURGICAL) ×3
ELECTRODE BLDE 4.0 EZ CLN MEGD (MISCELLANEOUS) IMPLANT
ELECTRODE REM PT RTRN 9FT ADLT (ELECTROSURGICAL) ×1 IMPLANT
FACESHIELD WRAPAROUND (MASK) ×6 IMPLANT
GLOVE BIO SURGEON STRL SZ8 (GLOVE) ×6 IMPLANT
GLOVE BIOGEL PI IND STRL 8 (GLOVE) ×2 IMPLANT
GLOVE BIOGEL PI INDICATOR 8 (GLOVE) ×4
GOWN STRL REUS W/ TWL LRG LVL3 (GOWN DISPOSABLE) ×1 IMPLANT
GOWN STRL REUS W/ TWL XL LVL3 (GOWN DISPOSABLE) ×2 IMPLANT
GOWN STRL REUS W/TWL LRG LVL3 (GOWN DISPOSABLE) ×2
GOWN STRL REUS W/TWL XL LVL3 (GOWN DISPOSABLE) ×4
KIT BASIN OR (CUSTOM PROCEDURE TRAY) ×3 IMPLANT
KIT ROOM TURNOVER OR (KITS) ×3 IMPLANT
MANIFOLD NEPTUNE II (INSTRUMENTS) ×3 IMPLANT
NEEDLE HYPO 22GX1.5 SAFETY (NEEDLE) ×3 IMPLANT
NS IRRIG 1000ML POUR BTL (IV SOLUTION) ×3 IMPLANT
PACK TOTAL JOINT (CUSTOM PROCEDURE TRAY) ×3 IMPLANT
PAD ARMBOARD 7.5X6 YLW CONV (MISCELLANEOUS) ×6 IMPLANT
RTRCTR WOUND ALEXIS 18CM MED (MISCELLANEOUS) ×3
STRIP CLOSURE SKIN 1/2X4 (GAUZE/BANDAGES/DRESSINGS) ×2 IMPLANT
SUT ETHIBOND NAB CT1 #1 30IN (SUTURE) ×6 IMPLANT
SUT MNCRL AB 3-0 PS2 27 (SUTURE) ×3 IMPLANT
SUT VIC AB 1 CT1 27 (SUTURE) ×2
SUT VIC AB 1 CT1 27XBRD ANBCTR (SUTURE) ×1 IMPLANT
SUT VIC AB 2-0 CT1 27 (SUTURE) ×2
SUT VIC AB 2-0 CT1 TAPERPNT 27 (SUTURE) ×1 IMPLANT
SUT VLOC 180 0 24IN GS25 (SUTURE) ×3 IMPLANT
SYR 50ML LL SCALE MARK (SYRINGE) ×3 IMPLANT
TOWEL OR 17X24 6PK STRL BLUE (TOWEL DISPOSABLE) ×3 IMPLANT
TOWEL OR 17X26 10 PK STRL BLUE (TOWEL DISPOSABLE) ×3 IMPLANT
TRAY CATH 16FR W/PLASTIC CATH (SET/KITS/TRAYS/PACK) IMPLANT
TRAY FOLEY W/METER SILVER 16FR (SET/KITS/TRAYS/PACK) ×3 IMPLANT
WATER STERILE IRR 1000ML POUR (IV SOLUTION) ×3 IMPLANT

## 2016-12-23 NOTE — Anesthesia Procedure Notes (Signed)
Procedure Name: LMA Insertion Date/Time: 12/23/2016 8:04 AM Performed by: Annabelle HarmanSMITH, Sora Vrooman A Pre-anesthesia Checklist: Patient identified, Emergency Drugs available, Suction available and Patient being monitored Patient Re-evaluated:Patient Re-evaluated prior to inductionOxygen Delivery Method: Circle system utilized Preoxygenation: Pre-oxygenation with 100% oxygen Intubation Type: IV induction Ventilation: Mask ventilation without difficulty LMA: LMA inserted LMA Size: 4.0 Number of attempts: 1 Placement Confirmation: positive ETCO2 and breath sounds checked- equal and bilateral Tube secured with: Tape Dental Injury: Teeth and Oropharynx as per pre-operative assessment

## 2016-12-23 NOTE — Evaluation (Signed)
Physical Therapy Evaluation Patient Details Name: Cheryl Nash MRN: [id number] DOB: 08/07/1941 Today's Date: 12/23/2016   History of Present Illness  76 y.o. female now s/p Lt direct anterior THA. PMH: back surgery, osteoporosis.   Clinical Impression  Pt is s/p Lt direct anterior THA resulting in the deficits listed below (see PT Problem List). Pt doing very well with mobility during initial PT session (ambulating 100 ft with rw, min guard assist). Anticipate that the pt will D/C to home with spouse to assist.  Pt will benefit from skilled PT to increase their independence and safety with mobility.      Follow Up Recommendations Home health PT;Supervision for mobility/OOB    Equipment Recommendations  None recommended by PT    Recommendations for Other Services       Precautions / Restrictions Precautions Precautions: Fall Precaution Comments: no hip precautions Restrictions Weight Bearing Restrictions: Yes LLE Weight Bearing: Weight bearing as tolerated      Mobility  Bed Mobility Overal bed mobility: Needs Assistance Bed Mobility: Supine to Sit     Supine to sit: Supervision     General bed mobility comments: HOB elevated, rails  Transfers Overall transfer level: Needs assistance Equipment used: Rolling walker (2 wheeled) Transfers: Sit to/from Stand Sit to Stand: Min guard         General transfer comment: cues for hand placement  Ambulation/Gait Ambulation/Gait assistance: Min guard Ambulation Distance (Feet): 100 Feet Assistive device: Rolling walker (2 wheeled) Gait Pattern/deviations: Step-through pattern;Decreased weight shift to left Gait velocity: decreased   General Gait Details: cues for even strides and posture  Stairs            Wheelchair Mobility    Modified Rankin (Stroke Patients Only)       Balance Overall balance assessment: Needs assistance Sitting-balance support: No upper extremity supported Sitting balance-Leahy  Scale: Good     Standing balance support: Bilateral upper extremity supported Standing balance-Leahy Scale: Poor Standing balance comment: using rw for support                             Pertinent Vitals/Pain Pain Assessment: 0-10 Pain Score: 4  Pain Location: Lt hip Pain Descriptors / Indicators: Sore Pain Intervention(s): Limited activity within patient's tolerance;Monitored during session;Ice applied    Home Living Family/patient expects to be discharged to:: Private residence Living Arrangements: Spouse/significant other Available Help at Discharge: Family;Available 24 hours/day Type of Home: House Home Access: Stairs to enter Entrance Stairs-Rails: None Entrance Stairs-Number of Steps: 2 Home Layout: Two level Home Equipment: Walker - 2 wheels;Cane - single point Additional Comments: bedroom/bath on second level    Prior Function Level of Independence: Independent         Comments: active hiker     Hand Dominance        Extremity/Trunk Assessment   Upper Extremity Assessment Upper Extremity Assessment: Overall WFL for tasks assessed    Lower Extremity Assessment Lower Extremity Assessment: LLE deficits/detail LLE Deficits / Details: able to move LE without assistance        Communication   Communication: No difficulties  Cognition Arousal/Alertness: Awake/alert Behavior During Therapy: WFL for tasks assessed/performed Overall Cognitive Status: Within Functional Limits for tasks assessed                      General Comments      Exercises     Assessment/Plan    PT  Assessment Patient needs continued PT services  PT Problem List Decreased strength;Decreased range of motion;Decreased activity tolerance;Decreased balance;Decreased mobility       PT Treatment Interventions DME instruction;Gait training;Stair training;Functional mobility training;Therapeutic activities;Therapeutic exercise;Patient/family education    PT  Goals (Current goals can be found in the Care Plan section)  Acute Rehab PT Goals Patient Stated Goal: get back to hiking PT Goal Formulation: With patient Time For Goal Achievement: 01/06/17 Potential to Achieve Goals: Good    Frequency 7X/week   Barriers to discharge        Co-evaluation               End of Session Equipment Utilized During Treatment: Gait belt Activity Tolerance: Patient tolerated treatment well Patient left: in chair;with call bell/phone within reach;with family/visitor present Nurse Communication: Mobility status;Precautions;Weight bearing status PT Visit Diagnosis: Unsteadiness on feet (R26.81);Muscle weakness (generalized) (M62.81)         Time: [id number] PT Time Calculation (min) (ACUTE ONLY): 25 min   Charges:   PT Evaluation $PT Eval Moderate Complexity: 1 Procedure PT Treatments $Gait Training: 8-22 mins   PT G Codes:         Christiane HaBenjamin J. Bronislaus Verdell, PT, CSCS Pager [phone] Office [phone]  12/23/2016, 3:36 PM

## 2016-12-23 NOTE — Anesthesia Procedure Notes (Signed)
Procedure Name: MAC Date/Time: 12/23/2016 7:38 AM Performed by: Everlean Cherry A Pre-anesthesia Checklist: Patient identified, Emergency Drugs available, Suction available and Patient being monitored Patient Re-evaluated:Patient Re-evaluated prior to inductionOxygen Delivery Method: Nasal cannula

## 2016-12-23 NOTE — Transfer of Care (Signed)
Immediate Anesthesia Transfer of Care Note  Patient: Cheryl Nash  Procedure(s) Performed: Procedure(s): TOTAL HIP ARTHROPLASTY ANTERIOR APPROACH (Left)  Patient Location: PACU  Anesthesia Type:General  Level of Consciousness: awake, alert , oriented and patient cooperative  Airway & Oxygen Therapy: Patient Spontanous Breathing and Patient connected to face mask oxygen  Post-op Assessment: Report given to RN and Post -op Vital signs reviewed and stable  Post vital signs: Reviewed and stable  Last Vitals:  Vitals:   12/23/16 0611 12/23/16 0950  BP: (!) 141/65 (!) 106/57  Pulse: (!) 57 62  Resp: 18 14  Temp: 36.5 C 36.3 C    Last Pain:  Vitals:   12/23/16 0611  TempSrc: Oral      Patients Stated Pain Goal: 3 (12/23/16 0606)  Complications: No apparent anesthesia complications

## 2016-12-23 NOTE — Interval H&P Note (Signed)
History and Physical Interval Note:  12/23/2016 7:17 AM  Cheryl SleightSharye J Orihuela  has presented today for surgery, with the diagnosis of LEFT HIP DEGENERATIVE JOINT DISEASE  The various methods of treatment have been discussed with the patient and family. After consideration of risks, benefits and other options for treatment, the patient has consented to  Procedure(s): TOTAL HIP ARTHROPLASTY ANTERIOR APPROACH (Left) as a surgical intervention .  The patient's history has been reviewed, patient examined, no change in status, stable for surgery.  I have reviewed the patient's chart and labs.  Questions were answered to the patient's satisfaction.     Shauntelle Jamerson G

## 2016-12-23 NOTE — Anesthesia Procedure Notes (Signed)
Spinal  Patient location during procedure: OR Staffing Anesthesiologist: Raymel Cull Performed: anesthesiologist  Preanesthetic Checklist Completed: patient identified, site marked, surgical consent, pre-op evaluation, timeout performed, IV checked, risks and benefits discussed and monitors and equipment checked Spinal Block Patient position: sitting Prep: ChloraPrep and site prepped and draped Patient monitoring: heart rate, continuous pulse ox and blood pressure Approach: right paramedian Location: L2-3 Injection technique: single-shot Needle Needle type: Sprotte  Needle gauge: 24 G Needle length: 9 cm Additional Notes Expiration date of kit checked and confirmed. Patient tolerated procedure well, without complications.       

## 2016-12-23 NOTE — Anesthesia Postprocedure Evaluation (Addendum)
Anesthesia Post Note  Patient: Cheryl Nash  Procedure(s) Performed: Procedure(s) (LRB): TOTAL HIP ARTHROPLASTY ANTERIOR APPROACH (Left)  Patient location during evaluation: PACU Anesthesia Type: Spinal and General Level of consciousness: awake and alert Pain management: pain level controlled Vital Signs Assessment: post-procedure vital signs reviewed and stable Respiratory status: spontaneous breathing and respiratory function stable Cardiovascular status: blood pressure returned to baseline and stable Postop Assessment: spinal receding Anesthetic complications: no       Last Vitals:  Vitals:   12/23/16 1238 12/23/16 1500  BP: 111/60 117/63  Pulse: 68 74  Resp: 14 16  Temp: 36.4 C 36.4 C    Last Pain:  Vitals:   12/23/16 1500  TempSrc: Oral  PainSc:                  Lewie LoronJohn Vielka Klinedinst

## 2016-12-23 NOTE — Op Note (Signed)
PRE-OP DIAGNOSIS:  LEFT HIP DEGENERATIVE JOINT DISEASE POST-OP DIAGNOSIS: same PROCEDURE:  LEFT TOTAL HIP ARTHROPLASTY ANTERIOR APPROACH ANESTHESIA:  General after spinal SURGEON:  Marcene CorningPeter Joanann Mies MD ASSISTANT:  Elodia FlorenceAndrew Nida PA-C   INDICATIONS FOR PROCEDURE:  The patient is a 76 y.o. female with a long history of a painful hip.  This has persisted despite multiple conservative measures.  The patient has persisted with pain and dysfunction making rest and activity difficult.  A total hip replacement is offered as surgical treatment.  Informed operative consent was obtained after discussion of possible complications including reaction to anesthesia, infection, neurovascular injury, dislocation, DVT, PE, and death.  The importance of the postoperative rehab program to optimize result was stressed with the patient.  SUMMARY OF FINDINGS AND PROCEDURE:  Under general anesthesia through a anterior approach an the Hana table a left THR was performed.  The patient had severe degenerative change and excellent bone quality.  We used DePuy components to replace the hip and these were size KA12 Corail femur capped with a +1.5 36mm ceramic hip ball.  On the acetabular side we used a size 54 Gription shell with a plus 4 neutral polyethylene liner.  We did use a hole eliminator.  Elodia FlorenceAndrew Nida PA-C assisted throughout and was invaluable to the completion of the case in that he helped position and retract while I performed the procedure.  He also closed simultaneously to help minimize OR time.  I used fluoroscopy throughout the case to check position of components and leg lengths and read all these views myself.  DESCRIPTION OF PROCEDURE:  The patient was taken to the OR suite where general anesthetic was applied.  The patient was then positioned on the Hana table supine.  All bony prominences were appropriately padded.  Prep and drape was then performed in normal sterile fashion.  The patient was given vancomycin  preoperative antibiotic and an appropriate time out was performed.  We then took an anterior approach to the left hip.  Dissection was taken through adipose to the tensor fascia lata fascia.  This structure was incised longitudinally and we dissected in the intermuscular interval just medial to this muscle.  Cobra retractors were placed superior and inferior to the femoral neck superficial to the capsule.  A capsular incision was then made and the retractors were placed along the femoral neck.  Xray was brought in to get a good level for the femoral neck cut which was made with an oscillating saw and osteotome.  The femoral head was removed with a corkscrew.  The acetabulum was exposed and some labral tissues were excised. Reaming was taken to the inside wall of the pelvis and sequentially up to 1 mm smaller than the actual component.  A trial of components was done and then the aforementioned acetabular shell was placed in appropriate tilt and anteversion confirmed by fluoroscopy. The liner was placed along with the hole eliminator and attention was turned to the femur.  The leg was brought down and over into adduction and the elevator bar was used to raise the femur up gently in the wound.  The piriformis was released with care taken to preserve the obturator internus attachment and all of the posterior capsule. The femur was reamed and then broached to the appropriate size.  A trial reduction was done and the aforementioned head and neck assembly gave us the best stability in extension with external rotation.  Leg lengths were felt to be about equal by fluoroscopic exam.  The trial components were removed and the wound irrigated.  We then placed the femoral component in appropriate anteversion.  The head was applied to a dry stem neck and the hip again reduced.  It was again stable in the aforementioned position.  The would was irrigated again followed by re-approximation of anterior capsule with ethibond suture.  Tensor fascia was repaired with V-loc suture  followed by deep closure with #O and #2 undyed vicryl.  Skin was closed with subQ stitch and steristrips followed by a sterile dressing.  EBL and IOF can be obtained from anesthesia records.  DISPOSITION:  The patient was extubated in the OR and taken to PACU in stable condition to be admitted to the Orthopedic Surgery for appropriate post-op care to include perioperative antibiotics and DVT prophylaxis.

## 2016-12-24 ENCOUNTER — Encounter (HOSPITAL_COMMUNITY): Payer: Self-pay | Admitting: Orthopaedic Surgery

## 2016-12-24 MED ORDER — ONDANSETRON HCL 4 MG PO TABS: 4.0000 mg | ORAL_TABLET | Freq: Four times a day (QID) | ORAL | 0 refills | Status: DC | PRN

## 2016-12-24 MED ORDER — ASPIRIN 325 MG PO TBEC: 325.0000 mg | DELAYED_RELEASE_TABLET | Freq: Two times a day (BID) | ORAL | 0 refills | Status: DC

## 2016-12-24 MED ORDER — METHOCARBAMOL 500 MG PO TABS: 500.0000 mg | ORAL_TABLET | Freq: Four times a day (QID) | ORAL | 0 refills | Status: DC | PRN

## 2016-12-24 MED ORDER — HYDROCODONE-ACETAMINOPHEN 5-325 MG PO TABS: 1.0000 | ORAL_TABLET | ORAL | 0 refills | Status: DC | PRN

## 2016-12-24 NOTE — Progress Notes (Signed)
Subjective: 1 Day Post-Op Procedure(s) (LRB): TOTAL HIP ARTHROPLASTY ANTERIOR APPROACH (Left)   Patient is sitting up eating breakfast. She states she feels well and is hoping to go home today.  Activity level:  wbat Diet tolerance:  ok Voiding:  Foley out this morning Patient reports pain as mild.    Objective: Vital signs in last 24 hours: Temp:  [97.3 F (36.3 C)-98.3 F (36.8 C)] 98.3 F (36.8 C) (02/21 0556) Pulse Rate:  [55-78] 78 (02/21 0556) Resp:  [12-20] 16 (02/21 0556) BP: (93-131)/(45-87) 120/55 (02/21 0556) SpO2:  [97 %-100 %] 97 % (02/21 0556)  Labs: No results for input(s): HGB in the last 72 hours. No results for input(s): WBC, RBC, HCT, PLT in the last 72 hours. No results for input(s): NA, K, CL, CO2, BUN, CREATININE, GLUCOSE, CALCIUM in the last 72 hours. No results for input(s): LABPT, INR in the last 72 hours.  Physical Exam:  Neurologically intact ABD soft Neurovascular intact Sensation intact distally Intact pulses distally Dorsiflexion/Plantar flexion intact Incision: dressing C/D/I and no drainage No cellulitis present Compartment soft  Assessment/Plan:  1 Day Post-Op Procedure(s) (LRB): TOTAL HIP ARTHROPLASTY ANTERIOR APPROACH (Left) Advance diet Up with therapy D/C IV fluids Discharge home with home health toady if cleared by PT. Continue on ASA 325mg  BID x 4 weeks post op. Follow up in office 2 weeks post op.  Ritter Helsley, Ginger OrganNDREW PAUL 12/24/2016, 8:02 AM

## 2016-12-24 NOTE — Progress Notes (Signed)
Pt ready for d/c home today per MD if pt does well with therapy. Pt met PT/OT goals, has equipment at home. Discharge instructions and prescriptions reviewed with pt and husband, all questions answered. Belongings gathered and sent with pt.   Swisher, Jerry Caras

## 2016-12-24 NOTE — Care Management Note (Signed)
Case Management Note  Patient Details  Name: Cheryl Nash MRN: [id number] Date of Birth: December 12, 1940  Subjective/Objective:   76 yr old female s/p left anterior hip arthroplasty.                 Action/Plan: Case manager spoke with patient and her husband concerning Home health and DME needs. Choice was offered for Home Health agency. [facility]. Patient has rolling walker and 3in1. She will have family support at discharge.   Expected Discharge Date:  12/24/16               Expected Discharge Plan:  Home w Home Health Services  In-House Referral:  NA  Discharge planning Services  CM Consult  Post Acute Care Choice:  Home Health Choice offered to:  Patient, Spouse  DME Arranged:  (S) N/A (Has rolling walker and 3in1) DME Agency:  NA  HH Arranged:  PT HH Agency:  [facility]  Status of Service:  Completed, signed off  If discussed at Long Length of Stay Meetings, dates discussed:    Additional Comments:  Durenda GuthrieBrady, Whittley Carandang Naomi, RN 12/24/2016, 11:43 AM

## 2016-12-24 NOTE — Discharge Summary (Signed)
Patient ID: Cheryl Nash MRN: [id number] DOB/AGE: 1940-11-08 76 y.o.  Admit date: 12/23/2016 Discharge date: 12/24/2016  Admission Diagnoses:  Principal Problem:   Primary osteoarthritis of left hip   Discharge Diagnoses:  Same  Past Medical History:  Diagnosis Date  . Allergic rhinitis   . Chronic constipation   . Hepatitis    2003   TOXIC HEPATITIS  . Histoplasmosis 1972  . Hyperlipidemia   . Hypoglycemia   . Hypothyroidism   . MVP (mitral valve prolapse)   . Osteoporosis   . Pancreatitis 2001  . Pneumonia [phone]  . Psoriasis     Surgeries: Procedure(s): TOTAL HIP ARTHROPLASTY ANTERIOR APPROACH on 12/23/2016   Consultants:   Discharged Condition: Improved  Hospital Course: Cheryl Nash is an 76 y.o. female who was admitted 12/23/2016 for operative treatment ofPrimary osteoarthritis of left hip. Patient has severe unremitting pain that affects sleep, daily activities, and work/hobbies. After pre-op clearance the patient was taken to the operating room on 12/23/2016 and underwent  Procedure(s): TOTAL HIP ARTHROPLASTY ANTERIOR APPROACH.    Patient was given perioperative antibiotics: Anti-infectives    Start     Dose/Rate Route Frequency Ordered Stop   12/23/16 1930  vancomycin (VANCOCIN) IVPB 1000 mg/200 mL premix     1,000 mg 200 mL/hr over 60 Minutes Intravenous Every 12 hours 12/23/16 1232 12/23/16 2331   12/23/16 0550  vancomycin (VANCOCIN) IVPB 1000 mg/200 mL premix     1,000 mg 200 mL/hr over 60 Minutes Intravenous On call to O.R. 12/23/16 0550 12/23/16 0825       Patient was given sequential compression devices, early ambulation, and chemoprophylaxis to prevent DVT.  Patient benefited maximally from hospital stay and there were no complications.    Recent vital signs: Patient Vitals for the past 24 hrs:  BP Temp Temp src Pulse Resp SpO2  12/24/16 0556 (!) 120/55 98.3 F (36.8 C) Oral 78 16 97 %  12/24/16 0026 120/64 97.9 F (36.6 C) Oral  75 16 99 %  12/23/16 2100 (!) 131/45 97.9 F (36.6 C) Oral 72 16 99 %  12/23/16 1500 117/63 97.5 F (36.4 C) Oral 74 16 99 %  12/23/16 1238 111/60 97.5 F (36.4 C) - 68 14 100 %  12/23/16 1220 102/87 97.6 F (36.4 C) - 67 12 100 %  12/23/16 1205 (!) 112/58 - - 66 20 99 %  12/23/16 1150 109/66 - - (!) 58 13 100 %  12/23/16 1135 101/69 - - 62 17 100 %  12/23/16 1120 104/67 - - (!) 56 13 100 %  12/23/16 1105 (!) 110/58 - - (!) 58 18 100 %  12/23/16 1050 (!) 104/57 - - (!) 55 14 100 %  12/23/16 1035 (!) 95/51 - - 61 17 97 %  12/23/16 1020 (!) 93/49 - - 61 13 100 %  12/23/16 1005 (!) 97/48 - - (!) 59 13 100 %  12/23/16 0950 (!) 106/57 97.3 F (36.3 C) - 62 14 100 %     Recent laboratory studies: No results for input(s): WBC, HGB, HCT, PLT, NA, K, CL, CO2, BUN, CREATININE, GLUCOSE, INR, CALCIUM in the last 72 hours.  Invalid input(s): PT, 2   Discharge Medications:   Allergies as of 12/24/2016      Reactions   Levaquin [levofloxacin In D5w] Other (See Comments)   Toxic hepatitis   Macrodantin [nitrofurantoin Macrocrystal] Anaphylaxis   Quinolones Other (See Comments)      HX TOXIC HEPATITIS   Amoxicillin  Other (See Comments)   . Fatigue, agitation and depression   Etodolac Itching, Rash   whelps   Mometasone Other (See Comments)   RED, ITCHY, SWOLLEN EYE LIDS   Premarin [conjugated Estrogens] Other (See Comments)   VAGINAL CREAM-  HEADACHE, NAUSEA, VAGINAL BURNING   Vioxx [rofecoxib] Itching, Other (See Comments)   ITCHING, SEVERE LARYNGITIS   Ketek [telithromycin]    UNSPECIFIED REACTION    Aspartame And Phenylalanine Rash   Augmentin [amoxicillin-pot Clavulanate] Itching, Other (See Comments)   Insomnia    Celebrex [celecoxib] Itching   Cephalexin Rash, Other (See Comments)   dermatitis   Ciprofloxacin Rash, Other (See Comments)   dermatitis   Clindamycin/lincomycin Nausea And Vomiting   Codeine Sulfate Itching, Rash   Demerol [meperidine] Other (See Comments)    INVOLUNTARY TWITCHING   Prilosec [omeprazole] Other (See Comments)   Abdominal pain    Septra [sulfamethoxazole-trimethoprim] Other (See Comments)   Headache, insomnia      Medication List    TAKE these medications   aspirin 325 MG EC tablet Take 1 tablet (325 mg total) by mouth 2 (two) times daily after a meal.   Aspirin-Caffeine 400-32 MG Tabs Take 2 capsules by mouth daily as needed (pain).   bisacodyl 5 MG EC tablet Commonly known as:  DULCOLAX Take 5 mg by mouth daily as needed for moderate constipation.   chlorpheniramine 4 MG tablet Commonly known as:  CHLOR-TRIMETON Take 4 mg by mouth every 4 (four) hours as needed for allergies.   CITRACAL PO Take 1 tablet by mouth daily.   EPIPEN 2-PAK 0.3 mg/0.3 mL Soaj injection Generic drug:  EPINEPHrine Inject 0.3 mg into the muscle once.   estradiol 0.1 MG/GM vaginal cream Commonly known as:  ESTRACE Place 1 Applicatorful vaginally 2 (two) times a week.   Fish Oil 1200 MG Caps Take 1 capsule by mouth daily.   HYDROcodone-acetaminophen 5-325 MG tablet Commonly known as:  NORCO/VICODIN Take 1-2 tablets by mouth every 4 (four) hours as needed (breakthrough pain).   levothyroxine 112 MCG tablet Commonly known as:  SYNTHROID, LEVOTHROID Take 112 mcg by mouth daily before breakfast.   liothyronine 5 MCG tablet Commonly known as:  CYTOMEL Take 5 mcg by mouth daily.   methocarbamol 500 MG tablet Commonly known as:  ROBAXIN Take 1 tablet (500 mg total) by mouth every 6 (six) hours as needed for muscle spasms.   multivitamin tablet Take 1 tablet by mouth daily. Centrum silver   nitroGLYCERIN 0.4 MG SL tablet Commonly known as:  NITROSTAT Place 0.4 mg under the tongue every 5 (five) minutes as needed for chest pain.   ondansetron 4 MG tablet Commonly known as:  ZOFRAN Take 1 tablet (4 mg total) by mouth every 6 (six) hours as needed for nausea.   vitamin B-12 1000 MCG tablet Commonly known as:   CYANOCOBALAMIN Take 1,000 mcg by mouth daily.            Durable Medical Equipment        Start     Ordered   12/23/16 1233  DME Walker rolling  Once    Question:  Patient needs a walker to treat with the following condition  Answer:  Primary osteoarthritis of left hip   12/23/16 1232   12/23/16 1233  DME 3 n 1  Once     12/23/16 1232   12/23/16 1233  DME Bedside commode  Once    Question:  Patient needs a bedside commode to treat  with the following condition  Answer:  Primary osteoarthritis of left hip   12/23/16 1232      Diagnostic Studies: Dg Chest 2 View  Result Date: 12/16/2016 CLINICAL DATA:  Preop for hip replacement. EXAM: CHEST  2 VIEW COMPARISON:  None. FINDINGS: The heart size and mediastinal contours are within normal limits. Both lungs are clear. No pneumothorax or pleural effusion is noted. The visualized skeletal structures are unremarkable. IMPRESSION: No active cardiopulmonary disease. Electronically Signed   By: Lupita Raider, M.D.   On: 12/16/2016 12:26   Dg C-arm 61-120 Min  Result Date: 12/23/2016 CLINICAL DATA:  Left hip replacement EXAM: DG C-ARM 61-120 MIN COMPARISON:  None. FINDINGS: C-arm fluoroscopy was provided during left hip replacement. Fluoroscopy time of 28 seconds was recorded. IMPRESSION: C-arm fluoroscopy provided. Electronically Signed   By: Dwyane Dee M.D.   On: 12/23/2016 09:16   Dg Hip Operative Unilat With Pelvis Left  Result Date: 12/23/2016 CLINICAL DATA:  Left hip replacement EXAM: OPERATIVE left HIP (WITH PELVIS IF PERFORMED) 3 VIEWS TECHNIQUE: Fluoroscopic spot image(s) were submitted for interpretation post-operatively. COMPARISON:  None. FINDINGS: Three C-arm spot films were returned. These show the femoral and acetabular components of the left hip replacement to be in good position on the images obtained. No complicating features are seen. IMPRESSION: Left total hip replacement with components in good position on the images  obtained. Electronically Signed   By: Dwyane Dee M.D.   On: 12/23/2016 09:17    Disposition: 01-Home or Self Care  Discharge Instructions    Call MD / Call 911    Complete by:  As directed    If you experience chest pain or shortness of breath, CALL 911 and be transported to the hospital emergency room.  If you develope a fever above 101 F, pus (white drainage) or increased drainage or redness at the wound, or calf pain, call your surgeon's office.   Constipation Prevention    Complete by:  As directed    Drink plenty of fluids.  Prune juice may be helpful.  You may use a stool softener, such as Colace (over the counter) 100 mg twice a day.  Use MiraLax (over the counter) for constipation as needed.   Diet - low sodium heart healthy    Complete by:  As directed    Discharge instructions    Complete by:  As directed    INSTRUCTIONS AFTER JOINT REPLACEMENT   Remove items at home which could result in a fall. This includes throw rugs or furniture in walking pathways ICE to the affected joint every three hours while awake for 30 minutes at a time, for at least the first 3-5 days, and then as needed for pain and swelling.  Continue to use ice for pain and swelling. You may notice swelling that will progress down to the foot and ankle.  This is normal after surgery.  Elevate your leg when you are not up walking on it.   Continue to use the breathing machine you got in the hospital (incentive spirometer) which will help keep your temperature down.  It is common for your temperature to cycle up and down following surgery, especially at night when you are not up moving around and exerting yourself.  The breathing machine keeps your lungs expanded and your temperature down.   DIET:  As you were doing prior to hospitalization, we recommend a well-balanced diet.  DRESSING / WOUND CARE / SHOWERING  You may shower  3 days after surgery, but keep the wounds dry during showering.  You may use an occlusive  plastic wrap (Press'n Seal for example), NO SOAKING/SUBMERGING IN THE BATHTUB.  If the bandage gets wet, change with a clean dry gauze.  If the incision gets wet, pat the wound dry with a clean towel.  ACTIVITY  Increase activity slowly as tolerated, but follow the weight bearing instructions below.   No driving for 6 weeks or until further direction given by your physician.  You cannot drive while taking narcotics.  No lifting or carrying greater than 10 lbs. until further directed by your surgeon. Avoid periods of inactivity such as sitting longer than an hour when not asleep. This helps prevent blood clots.  You may return to work once you are authorized by your doctor.     WEIGHT BEARING   Weight bearing as tolerated with assist device (walker, cane, etc) as directed, use it as long as suggested by your surgeon or therapist, typically at least 4-6 weeks.   EXERCISES  Results after joint replacement surgery are often greatly improved when you follow the exercise, range of motion and muscle strengthening exercises prescribed by your doctor. Safety measures are also important to protect the joint from further injury. Any time any of these exercises cause you to have increased pain or swelling, decrease what you are doing until you are comfortable again and then slowly increase them. If you have problems or questions, call your caregiver or physical therapist for advice.   Rehabilitation is important following a joint replacement. After just a few days of immobilization, the muscles of the leg can become weakened and shrink (atrophy).  These exercises are designed to build up the tone and strength of the thigh and leg muscles and to improve motion. Often times heat used for twenty to thirty minutes before working out will loosen up your tissues and help with improving the range of motion but do not use heat for the first two weeks following surgery (sometimes heat can increase post-operative  swelling).   These exercises can be done on a training (exercise) mat, on the floor, on a table or on a bed. Use whatever works the best and is most comfortable for you.    Use music or television while you are exercising so that the exercises are a pleasant break in your day. This will make your life better with the exercises acting as a break in your routine that you can look forward to.   Perform all exercises about fifteen times, three times per day or as directed.  You should exercise both the operative leg and the other leg as well.   Exercises include:   Quad Sets - Tighten up the muscle on the front of the thigh (Quad) and hold for 5-10 seconds.   Straight Leg Raises - With your knee straight (if you were given a brace, keep it on), lift the leg to 60 degrees, hold for 3 seconds, and slowly lower the leg.  Perform this exercise against resistance later as your leg gets stronger.  Leg Slides: Lying on your back, slowly slide your foot toward your buttocks, bending your knee up off the floor (only go as far as is comfortable). Then slowly slide your foot back down until your leg is flat on the floor again.  Angel Wings: Lying on your back spread your legs to the side as far apart as you can without causing discomfort.  Hamstring Strength:  Lying  on your back, push your heel against the floor with your leg straight by tightening up the muscles of your buttocks.  Repeat, but this time bend your knee to a comfortable angle, and push your heel against the floor.  You may put a pillow under the heel to make it more comfortable if necessary.   A rehabilitation program following joint replacement surgery can speed recovery and prevent re-injury in the future due to weakened muscles. Contact your doctor or a physical therapist for more information on knee rehabilitation.    CONSTIPATION  Constipation is defined medically as fewer than three stools per week and severe constipation as less than one  stool per week.  Even if you have a regular bowel pattern at home, your normal regimen is likely to be disrupted due to multiple reasons following surgery.  Combination of anesthesia, postoperative narcotics, change in appetite and fluid intake all can affect your bowels.   YOU MUST use at least one of the following options; they are listed in order of increasing strength to get the job done.  They are all available over the counter, and you may need to use some, POSSIBLY even all of these options:    Drink plenty of fluids (prune juice may be helpful) and high fiber foods Colace 100 mg by mouth twice a day  Senokot for constipation as directed and as needed Dulcolax (bisacodyl), take with full glass of water  Miralax (polyethylene glycol) once or twice a day as needed.  If you have tried all these things and are unable to have a bowel movement in the first 3-4 days after surgery call either your surgeon or your primary doctor.    If you experience loose stools or diarrhea, hold the medications until you stool forms back up.  If your symptoms do not get better within 1 week or if they get worse, check with your doctor.  If you experience "the worst abdominal pain ever" or develop nausea or vomiting, please contact the office immediately for further recommendations for treatment.   ITCHING:  If you experience itching with your medications, try taking only a single pain pill, or even half a pain pill at a time.  You can also use Benadryl over the counter for itching or also to help with sleep.   TED HOSE STOCKINGS:  Use stockings on both legs until for at least 2 weeks or as directed by physician office. They may be removed at night for sleeping.  MEDICATIONS:  See your medication summary on the "After Visit Summary" that nursing will review with you.  You may have some home medications which will be placed on hold until you complete the course of blood thinner medication.  It is important for you to  complete the blood thinner medication as prescribed.  PRECAUTIONS:  If you experience chest pain or shortness of breath - call 911 immediately for transfer to the hospital emergency department.   If you develop a fever greater that 101 F, purulent drainage from wound, increased redness or drainage from wound, foul odor from the wound/dressing, or calf pain - CONTACT YOUR SURGEON.                                                   FOLLOW-UP APPOINTMENTS:  If you do not already have a post-op appointment,  please call the office for an appointment to be seen by your surgeon.  Guidelines for how soon to be seen are listed in your "After Visit Summary", but are typically between 1-4 weeks after surgery.  OTHER INSTRUCTIONS:   Knee Replacement:  Do not place pillow under knee, focus on keeping the knee straight while resting. CPM instructions: 0-90 degrees, 2 hours in the morning, 2 hours in the afternoon, and 2 hours in the evening. Place foam block, curve side up under heel at all times except when in CPM or when walking.  DO NOT modify, tear, cut, or change the foam block in any way.  MAKE SURE YOU:  Understand these instructions.  Get help right away if you are not doing well or get worse.    Thank you for letting us be a part of your medical care team.  It is a privilege we respect greatly.  We hope these instructions will help you stay on track for a fast and full recovery!   Increase activity slowly as tolerated    Complete by:  As directed       Follow-up Information    DALLDORF,PETER G, MD. Schedule an appointment as soon as possible for a visit in 2 weeks.   Specialty:  Orthopedic Surgery Contact information: [address] [place] [address] [phone]            Signed: Drema Halon 12/24/2016, 8:06 AM

## 2016-12-24 NOTE — Evaluation (Signed)
Occupational Therapy Evaluation Patient Details Name: Cheryl Nash MRN: [id number] DOB: [birth year] Today's Date: 12/24/2016    History of Present Illness 76 y.o. female now s/p Lt direct anterior THA. PMH: back surgery, osteoporosis.    Clinical Impression   Pt is at min A level with LB ADLs and min guard A with ADL mobility using RW. Pt will have 24/7 assist from her husband. All education completed and no further acute OT indicated at this time    Follow Up Recommendations  No OT follow up;Supervision/Assistance - 24 hour    Equipment Recommendations  Other (comment) (reacher)    Recommendations for Other Services       Precautions / Restrictions Precautions Precautions: Fall Precaution Comments: no hip precautions Restrictions Weight Bearing Restrictions: Yes LLE Weight Bearing: Weight bearing as tolerated      Mobility Bed Mobility Overal bed mobility: Needs Assistance Bed Mobility: Supine to Sit;Sit to Supine     Supine to sit: Supervision Sit to supine: Supervision   General bed mobility comments: pt OOB in chair upon arrival  Transfers Overall transfer level: Needs assistance Equipment used: Rolling walker (2 wheeled) Transfers: Sit to/from Stand Sit to Stand: Min guard         General transfer comment: safe hand placement    Balance Overall balance assessment: Needs assistance Sitting-balance support: No upper extremity supported Sitting balance-Leahy Scale: Good     Standing balance support: Bilateral upper extremity supported;During functional activity Standing balance-Leahy Scale: Fair Standing balance comment: using rw for support                            ADL Overall ADL's : Needs assistance/impaired     Grooming: Wash/dry hands;Wash/dry face;Standing;Min guard   Upper Body Bathing: Set up;Sitting   Lower Body Bathing: Minimal assistance   Upper Body Dressing : Set up;Sitting   Lower Body Dressing: Minimal  assistance   Toilet Transfer: Min guard;Ambulation;RW;Comfort height toilet   Toileting- Clothing Manipulation and Hygiene: Min guard;Sit to/from stand   Tub/ Engineer, structuralhower Transfer: Minimal assistance   Functional mobility during ADLs: Min guard;Rolling walker General ADL Comments: pt and hsuband educated on ADL A/E for home use, shower transfer techniques     Vision Baseline Vision/History: Wears glasses Patient Visual Report: No change from baseline                  Pertinent Vitals/Pain Pain Assessment: 0-10 Pain Score: 5  Pain Location: L hip Pain Descriptors / Indicators: Sore Pain Intervention(s): Limited activity within patient's tolerance;Monitored during session;Premedicated before session;Repositioned     Hand Dominance Right   Extremity/Trunk Assessment Upper Extremity Assessment Upper Extremity Assessment: Overall WFL for tasks assessed           Communication Communication Communication: No difficulties   Cognition Arousal/Alertness: Awake/alert Behavior During Therapy: WFL for tasks assessed/performed Overall Cognitive Status: Within Functional Limits for tasks assessed                     General Comments       Exercises       Shoulder Instructions      Home Living Family/patient expects to be discharged to:: Private residence Living Arrangements: Spouse/significant other Available Help at Discharge: Family;Available 24 hours/day Type of Home: House Home Access: Stairs to enter Entergy CorporationEntrance Stairs-Number of Steps: 2 Entrance Stairs-Rails: None Home Layout: Two level Alternate Level Stairs-Number of Steps: flight  Alternate Level Stairs-Rails:  Left Bathroom Shower/Tub: Walk-in shower   Bathroom Toilet: Handicapped height     Home Equipment: Environmental consultant - 2 wheels;Cane - single point   Additional Comments: bedroom/bath on second level      Prior Functioning/Environment Level of Independence: Independent        Comments: active  hiker        OT Problem List: Pain;Decreased knowledge of use of DME or AE;Decreased activity tolerance;Impaired balance (sitting and/or standing)      OT Treatment/Interventions:      OT Goals(Current goals can be found in the care plan section) Acute Rehab OT Goals Patient Stated Goal: get back to hiking OT Goal Formulation: With patient/family  OT Frequency:     Barriers to D/C:    no barreirs, husband will assist 24/7 prn       Co-evaluation              End of Session Equipment Utilized During Treatment: Rolling walker;Other (comment) (3 in 1)  Activity Tolerance: Patient tolerated treatment well Patient left: in bed;with call bell/phone within reach;with family/visitor present  OT Visit Diagnosis: Pain;Unsteadiness on feet (R26.81) Pain - Right/Left: Left Pain - part of body: Hip;Leg                ADL either performed or assessed with clinical judgement  Time: 1610-9604 OT Time Calculation (min): 20 min Charges:  OT General Charges $OT Visit: 1 Procedure OT Evaluation $OT Eval Low Complexity: 1 Procedure :       Galen Manila 12/24/2016, 12:31 PM

## 2016-12-24 NOTE — Progress Notes (Signed)
Physical Therapy Treatment Note  Patient is progressing well toward mobility goals. Current plan remains appropriate.   12/24/16 1052  PT Visit Information  Last PT Received On 12/24/16  Assistance Needed +1  History of Present Illness 76 y.o. female now s/p Lt direct anterior THA. PMH: back surgery, osteoporosis.   Subjective Data  Patient Stated Goal get back to hiking   Precautions  Precautions Fall  Precaution Comments no hip precautions  Restrictions  Weight Bearing Restrictions Yes  LLE Weight Bearing WBAT  Pain Assessment  Pain Assessment 0-10  Pain Score 6  Pain Location Lt hip after stair training (3 prior)  Pain Descriptors / Indicators Sore;Tightness  Pain Intervention(s) Limited activity within patient's tolerance;Monitored during session;Repositioned;RN gave pain meds during session  Cognition  Arousal/Alertness Awake/alert  [facility] for tasks assessed/performed  Overall Cognitive Status Within Functional Limits for tasks assessed  Bed Mobility  General bed mobility comments pt OOB in chair upon arrival  Transfers  Overall transfer level Needs assistance  Equipment used Rolling walker (2 wheeled)  Transfers Sit to/from Stand  Sit to Stand Min guard  General transfer comment safe hand placement  Ambulation/Gait  Ambulation/Gait assistance Supervision  Ambulation Distance (Feet) 140 Feet  Assistive device Rolling walker (2 wheeled)  Gait Pattern/deviations Step-through pattern;Decreased weight shift to left;Decreased step length - left  General Gait Details cues for posture and L heel strike; improved step length symmetry and WB with increased distance  Gait velocity decreased  Stairs Yes  Stairs assistance Min guard  Stair Management No rails;Step to pattern;Backwards;With walker;One rail Left;Sideways  Number of Stairs (2 backwards and 3 sideways)  General stair comments cues for sequencing and technique; min guard for safety; husband  present  Balance  Overall balance assessment Needs assistance  Sitting-balance support No upper extremity supported  Sitting balance-Leahy Scale Good  Standing balance support Bilateral upper extremity supported  Standing balance-Leahy Scale Fair  Standing balance comment using rw for support  Exercises  Exercises Total Joint  Total Joint Exercises  Quad Sets AROM;Both;10 reps  Short Arc Quad AROM;Right;10 reps  Heel Slides AROM;Right;10 reps  Hip ABduction/ADduction AROM;Right;10 reps  Long Arc Quad AROM;Right;10 reps  Knee Flexion AROM;Right;5 reps;Standing  PT - End of Session  Equipment Utilized During Treatment Gait belt  Activity Tolerance Patient tolerated treatment well  Patient left in chair;with call bell/phone within reach;with family/visitor present  Nurse Communication Mobility status;Precautions;Weight bearing status  PT - Assessment/Plan  PT Plan Current plan remains appropriate  PT Visit Diagnosis Unsteadiness on feet (R26.81);Muscle weakness (generalized) (M62.81)  PT Frequency (ACUTE ONLY) 7X/week  Follow Up Recommendations Home health PT;Supervision for mobility/OOB  PT equipment None recommended by PT  AM-PAC PT "6 Clicks" Daily Activity Outcome Measure  Difficulty turning over in bed (including adjusting bedclothes, sheets and blankets)? 3  Difficulty moving from lying on back to sitting on the side of the bed?  3  Difficulty sitting down on and standing up from a chair with arms (e.g., wheelchair, bedside commode, etc,.)? 3  Help needed moving to and from a bed to chair (including a wheelchair)? 3  Help needed walking in hospital room? 3  Help needed climbing 3-5 steps with a railing?  3  6 Click Score 18  Mobility G Code  CK  PT Goal Progression  Progress towards PT goals Progressing toward goals  Acute Rehab PT Goals  PT Goal Formulation With patient  Time For Goal Achievement 01/06/17  Potential to Achieve  Goals Good  PT Time Calculation  PT Start  Time (ACUTE ONLY) 0951  PT Stop Time (ACUTE ONLY) 1026  PT Time Calculation (min) (ACUTE ONLY) 35 min  PT General Charges  $$ ACUTE PT VISIT 1 Procedure  PT Treatments  $Gait Training 8-22 mins  $Therapeutic Exercise 8-22 mins  Erline Levine, PTA Pager: [phone]

## 2016-12-25 ENCOUNTER — Other Ambulatory Visit: Payer: Self-pay

## 2016-12-25 LAB — TYPE AND SCREEN
ABO/RH(D): O POS
Antibody Screen: POSITIVE
DAT, IGG: NEGATIVE
DONOR AG TYPE: NEGATIVE
Donor AG Type: NEGATIVE
PT AG TYPE: NEGATIVE
UNIT DIVISION: 0
Unit division: 0

## 2016-12-25 NOTE — Patient Outreach (Signed)
[facility] ([facility]) Care Management  12/25/2016  Cheryl SleightSharye J Nash [birth year] [id number]  REFERRAL DATE: 12/25/16  [facility]  REFERRAL REASON:  Transition of care follow up CONSENT:  Patient gave verbal consent for transition of care follow up calls with RNCM   PROVIDERS:  Dr. Johny BlamerWilliam Harris - primary MD Dr. Marcene CorningPeter Dalldorf - orthopedic surgeon  SOCIAL: Patient lives with spouse "good family support"  SUBJECTIVE;  Telephone call to patient regarding transition of care follow up. HIPAA verified with patient. Patient states, " I am doing great."  Patient denies being in any pain at this time. Patient states she is up and walking a lot with her rolling walker.  Patient states she has received a call from her primary MD office checking on her. Patient states her next follow up appointment with her primary MD is March 2018.  Patient states she has a follow up appointment with the orthopedic surgeon for Friday, January 02, 2017. Patient states she has all of her medications/ prescriptions. Patient states there were two prescriptions she did not get filled because she had them at home, aspirin and her hydrocodone.  Patient states the ones she had at home were the exact strength/dose as on the prescriptions.  Patient states she will be having home therapy. Patient states the home health agency [facility] contacted her while she was in the hospital. Patient states she has not heard from them yet since she has been home. RNCM gave patient [facility] number [facility]. Advised patient to call home health agency on tomorrow if she does not hear from them by end of business today. Patient verbalized understanding.  Patient states she has all of her DME equipment. Patient states she is using her rolling walker for ambulation.  RNCM  Reviewed discharge instructions with patient. RNCM advised patient to call doctor for any  signs/ symptoms of infection at surgical site.  Patient states she is aware of how to contact her doctors after hours.  RNCM advised patient to take her medications as prescribed and keep follow up appointments with her doctor.  RNCM advised patient  RNCM discussed transition of care follow up program with patient. Patient verbally agreed to transition of care program and next follow up call with RNCM.  Patient states she is icing the affected area as instructed. Patient reports she is wearing her TED hose as instructed.   ASSESSMENT; Admit date: 12/23/2016 Discharge date: 12/24/2016  Admission Diagnoses:  Principal Problem:   Primary osteoarthritis of left hip  Surgeries: Procedure(s): TOTAL HIP ARTHROPLASTY ANTERIOR APPROACH on 12/23/2016  PLAN: RNCM will follow up with patient within  2 weeks. RNCM will send patients primary MD involvement letter.  RNCM will send [facility] welcome packet.    George InaDavina Odile Veloso RN,BSN,CCM [facility] Telephonic  [phone]

## 2016-12-26 DIAGNOSIS — M1990 Unspecified osteoarthritis, unspecified site: Secondary | ICD-10-CM | POA: Diagnosis not present

## 2016-12-26 DIAGNOSIS — Z96642 Presence of left artificial hip joint: Secondary | ICD-10-CM | POA: Diagnosis not present

## 2016-12-26 DIAGNOSIS — R262 Difficulty in walking, not elsewhere classified: Secondary | ICD-10-CM | POA: Diagnosis not present

## 2016-12-26 DIAGNOSIS — M6281 Muscle weakness (generalized): Secondary | ICD-10-CM | POA: Diagnosis not present

## 2016-12-26 DIAGNOSIS — Z471 Aftercare following joint replacement surgery: Secondary | ICD-10-CM | POA: Diagnosis not present

## 2016-12-31 DIAGNOSIS — M1990 Unspecified osteoarthritis, unspecified site: Secondary | ICD-10-CM | POA: Diagnosis not present

## 2016-12-31 DIAGNOSIS — M6281 Muscle weakness (generalized): Secondary | ICD-10-CM | POA: Diagnosis not present

## 2016-12-31 DIAGNOSIS — R262 Difficulty in walking, not elsewhere classified: Secondary | ICD-10-CM | POA: Diagnosis not present

## 2017-01-01 ENCOUNTER — Other Ambulatory Visit: Payer: Self-pay

## 2017-01-02 DIAGNOSIS — Z471 Aftercare following joint replacement surgery: Secondary | ICD-10-CM | POA: Diagnosis not present

## 2017-01-02 DIAGNOSIS — Z96642 Presence of left artificial hip joint: Secondary | ICD-10-CM | POA: Diagnosis not present

## 2017-01-05 NOTE — Patient Outreach (Signed)
[facility]) Care Management  01/05/2017  Naida SleightSharye J Schumacher 09/23/1941 [id number]  Late entry for 01/01/17 Telephone call to patient for transition of care follow up. Unable to reach patient.  HIPAA compliant voice message left with call back phone number.   PLAN:  RNCM will attempt 2nd telephone call to patient within 1 week.   George InaDavina Chandni Gagan RN,BSN,CCM [facility] Telephonic  [phone]

## 2017-01-08 ENCOUNTER — Other Ambulatory Visit: Payer: Self-pay

## 2017-01-08 ENCOUNTER — Ambulatory Visit: Payer: Self-pay

## 2017-01-08 DIAGNOSIS — R262 Difficulty in walking, not elsewhere classified: Secondary | ICD-10-CM | POA: Diagnosis not present

## 2017-01-08 DIAGNOSIS — Z471 Aftercare following joint replacement surgery: Secondary | ICD-10-CM | POA: Diagnosis not present

## 2017-01-08 DIAGNOSIS — M25552 Pain in left hip: Secondary | ICD-10-CM | POA: Diagnosis not present

## 2017-01-08 DIAGNOSIS — Z96642 Presence of left artificial hip joint: Secondary | ICD-10-CM | POA: Diagnosis not present

## 2017-01-08 NOTE — Patient Outreach (Signed)
[facility]) Care Management  01/08/2017  Cheryl Nash 1940-12-16 [id number]  REFERRAL DATE: 12/25/16     REFERRAL SOURCE:  Post hospital discharge 12/24/16 referral  REFERRAL REASON:  Transition of care follow up CONSENT:  Patient gave verbal consent for transition of care follow up calls with RNCM   PROVIDERS:  Dr. Johny BlamerWilliam Harris - primary MD Dr. Marcene CorningPeter Dalldorf - orthopedic surgeon  SOCIAL: Patient lives with spouse  Second telephone outreach to patient regarding transition of care follow up.  Unable to reach patient. HIPAA compliant voice message left with call back phone number.   PLAN:  RNCM will attempt 2nd telephone call to patient within 1 week.   George InaDavina Aashka Salomone RN,BSN,CCM [facility] Telephonic  [phone]

## 2017-01-09 ENCOUNTER — Ambulatory Visit: Payer: Self-pay

## 2017-01-09 ENCOUNTER — Other Ambulatory Visit: Payer: Self-pay

## 2017-01-09 ENCOUNTER — Encounter (HOSPITAL_COMMUNITY): Payer: Self-pay

## 2017-01-09 ENCOUNTER — Emergency Department (HOSPITAL_COMMUNITY)
Admission: EM | Admit: 2017-01-09 | Discharge: 2017-01-09 | Disposition: A | Discharge status: Home / Self Care | Payer: Medicare HMO | Attending: Emergency Medicine | Admitting: Emergency Medicine

## 2017-01-09 DIAGNOSIS — Z7982 Long term (current) use of aspirin: Secondary | ICD-10-CM | POA: Diagnosis not present

## 2017-01-09 DIAGNOSIS — R0982 Postnasal drip: Secondary | ICD-10-CM | POA: Diagnosis not present

## 2017-01-09 DIAGNOSIS — Z96642 Presence of left artificial hip joint: Secondary | ICD-10-CM | POA: Diagnosis not present

## 2017-01-09 DIAGNOSIS — J3489 Other specified disorders of nose and nasal sinuses: Secondary | ICD-10-CM | POA: Diagnosis not present

## 2017-01-09 DIAGNOSIS — Z79899 Other long term (current) drug therapy: Secondary | ICD-10-CM | POA: Insufficient documentation

## 2017-01-09 DIAGNOSIS — M791 Myalgia, unspecified site: Secondary | ICD-10-CM

## 2017-01-09 DIAGNOSIS — E039 Hypothyroidism, unspecified: Secondary | ICD-10-CM | POA: Insufficient documentation

## 2017-01-09 DIAGNOSIS — R112 Nausea with vomiting, unspecified: Secondary | ICD-10-CM | POA: Diagnosis not present

## 2017-01-09 LAB — CBC WITH DIFFERENTIAL/PLATELET
BASOS ABS: 0 10*3/uL (ref 0.0–0.1)
BASOS PCT: 0 %
Eosinophils Absolute: 0.1 10*3/uL (ref 0.0–0.7)
Eosinophils Relative: 1 %
HEMATOCRIT: 35.2 % — AB (ref 36.0–46.0)
HEMOGLOBIN: 11.9 g/dL — AB (ref 12.0–15.0)
LYMPHS PCT: 13 %
Lymphs Abs: 0.9 10*3/uL (ref 0.7–4.0)
MCH: 31.3 pg (ref 26.0–34.0)
MCHC: 33.8 g/dL (ref 30.0–36.0)
MCV: 92.6 fL (ref 78.0–100.0)
MONO ABS: 0.3 10*3/uL (ref 0.1–1.0)
MONOS PCT: 5 %
NEUTROS ABS: 5.3 10*3/uL (ref 1.7–7.7)
NEUTROS PCT: 81 %
Platelets: 469 10*3/uL — ABNORMAL HIGH (ref 150–400)
RBC: 3.8 MIL/uL — ABNORMAL LOW (ref 3.87–5.11)
RDW: 12.4 % (ref 11.5–15.5)
WBC: 6.6 10*3/uL (ref 4.0–10.5)

## 2017-01-09 LAB — COMPREHENSIVE METABOLIC PANEL
ALBUMIN: 3.8 g/dL (ref 3.5–5.0)
ALK PHOS: 146 U/L — AB (ref 38–126)
ALT: 18 U/L (ref 14–54)
AST: 27 U/L (ref 15–41)
Anion gap: 7 (ref 5–15)
BILIRUBIN TOTAL: 0.8 mg/dL (ref 0.3–1.2)
BUN: 10 mg/dL (ref 6–20)
CALCIUM: 9.3 mg/dL (ref 8.9–10.3)
CO2: 26 mmol/L (ref 22–32)
CREATININE: 0.71 mg/dL (ref 0.44–1.00)
Chloride: 105 mmol/L (ref 101–111)
GFR calc Af Amer: >60 mL/min (ref >60)
GLUCOSE: 99 mg/dL (ref 65–99)
POTASSIUM: 3.8 mmol/L (ref 3.5–5.1)
Sodium: 138 mmol/L (ref 135–145)
TOTAL PROTEIN: 6.8 g/dL (ref 6.5–8.1)

## 2017-01-09 LAB — LIPASE, BLOOD: LIPASE: 25 U/L (ref 11–51)

## 2017-01-09 LAB — INFLUENZA PANEL BY PCR (TYPE A & B)
Influenza A By PCR: NEGATIVE
Influenza B By PCR: NEGATIVE

## 2017-01-09 NOTE — ED Notes (Signed)
Patient given water and ginger ale per patient request for oral trial.

## 2017-01-09 NOTE — Patient Outreach (Signed)
[facility]) Care Management  01/09/2017  Cheryl Nash 03-Sep-1941 [id number]   Chart review: Patient in emergency room today for emesis.     PLAN:  RNCM will follow up with patient within 1 week.   George InaDavina Olivene Cookston RN,BSN,CCM [facility] Telephonic  [phone]

## 2017-01-09 NOTE — ED Provider Notes (Signed)
[facility]-EMERGENCY DEPT Provider Note   CSN: [id number] Arrival date & time: 01/09/17  0800     History   Chief Complaint Chief Complaint  Patient presents with  . Emesis    HPI Cheryl Nash is a 76 y.o. female.  The history is provided by the patient.  Emesis   This is a new problem. The current episode started 1 to 2 hours ago. The problem occurs 2 to 4 times per day. The problem has been resolved. The emesis has an appearance of stomach contents. There has been no fever. Associated symptoms include chills, myalgias and URI. Pertinent negatives include no abdominal pain, no cough, no diarrhea, no fever and no headaches.   She reports thick sinus drainage since her intubation for left hip surgery 2-3 weeks ago.  Past Medical History:  Diagnosis Date  . Allergic rhinitis   . Chronic constipation   . Hepatitis    2003   TOXIC HEPATITIS  . Histoplasmosis 1972  . Hyperlipidemia   . Hypoglycemia   . Hypothyroidism   . MVP (mitral valve prolapse)   . Osteoporosis   . Pancreatitis 2001  . Pneumonia [phone]  . Psoriasis     Patient Active Problem List   Diagnosis Date Noted  . Primary osteoarthritis of left hip 12/23/2016    Past Surgical History:  Procedure Laterality Date  . ABDOMINAL HYSTERECTOMY    . APPENDECTOMY    . CATARACT EXTRACTION Bilateral 11/2015,12/2015  . CHOLECYSTECTOMY    . ESOPHAGEAL DILATION    . KNEE ARTHROSCOPY    . LIVER RESECTION    . LUMBAR DISC SURGERY  05/2000   L4-5  . LYSIS OF ADHESION  2001  . pancreatic duct  1985   enlargement of duct  . TONSILLECTOMY AND ADENOIDECTOMY  1948  . TOTAL ABDOMINAL HYSTERECTOMY W/ BILATERAL SALPINGOOPHORECTOMY  1970  . TOTAL HIP ARTHROPLASTY Left 12/23/2016   Procedure: TOTAL HIP ARTHROPLASTY ANTERIOR APPROACH;  Surgeon: Marcene Corning, MD;  Location: [facility] OR;  Service: Orthopedics;  Laterality: Left;    OB History    No data available       Home Medications    Prior to Admission medications    Medication Sig Start Date End Date Taking? Authorizing Provider  aspirin EC 325 MG EC tablet Take 1 tablet (325 mg total) by mouth 2 (two) times daily after a meal. Patient taking differently: Take 325 mg by mouth 2 (two) times daily after a meal. Started 02/21 for 30 days 12/24/16  Yes Elodia Florence, PA-C  aspirin EC 325 MG tablet Take 325-650 mg by mouth every 4 (four) hours as needed for mild pain, moderate pain or fever.   Yes Historical Provider, MD  bisacodyl (DULCOLAX) 5 MG EC tablet Take 5 mg by mouth daily as needed for moderate constipation.   Yes Historical Provider, MD  Calcium Citrate (CITRACAL PO) Take 1 tablet by mouth daily.   Yes Historical Provider, MD  chlorpheniramine (CHLOR-TRIMETON) 4 MG tablet Take 4 mg by mouth every 4 (four) hours as needed for allergies.   Yes Historical Provider, MD  EPINEPHrine (EPIPEN 2-PAK) 0.3 mg/0.3 mL IJ SOAJ injection Inject 0.3 mg into the muscle once as needed (for allergic reaction).    Yes Historical Provider, MD  estradiol (ESTRACE) 0.1 MG/GM vaginal cream Place 1 Applicatorful vaginally 2 (two) times a week.    Yes Historical Provider, MD  HYDROcodone-acetaminophen (NORCO/VICODIN) 5-325 MG tablet Take 1-2 tablets by mouth every 4 (four) hours as needed (  breakthrough pain). 12/24/16  Yes Elodia Florence, PA-C  levothyroxine (SYNTHROID, LEVOTHROID) 112 MCG tablet Take 112 mcg by mouth daily before breakfast.   Yes Historical Provider, MD  liothyronine (CYTOMEL) 5 MCG tablet Take 5 mcg by mouth daily.   Yes Historical Provider, MD  Multiple Vitamin (MULTIVITAMIN) tablet Take 1 tablet by mouth daily. Centrum silver   Yes Historical Provider, MD  nitroGLYCERIN (NITROSTAT) 0.4 MG SL tablet Place 0.4 mg under the tongue every 5 (five) minutes as needed for chest pain.   Yes Historical Provider, MD  Omega-3 Fatty Acids (FISH OIL) 1200 MG CAPS Take 1 capsule by mouth daily.   Yes Historical Provider, MD  ondansetron (ZOFRAN) 4 MG tablet Take 1 tablet (4 mg  total) by mouth every 6 (six) hours as needed for nausea. 12/24/16  Yes Elodia Florence, PA-C  vitamin B-12 (CYANOCOBALAMIN) 1000 MCG tablet Take 1,000 mcg by mouth daily.   Yes Historical Provider, MD  methocarbamol (ROBAXIN) 500 MG tablet Take 1 tablet (500 mg total) by mouth every 6 (six) hours as needed for muscle spasms. Patient not taking: Reported on 01/09/2017 12/24/16   Elodia Florence, PA-C    Family History Family History  Problem Relation Age of Onset  . Heart attack Mother   . CAD Mother   . Colon cancer Maternal Grandfather   . Aneurysm Maternal Grandmother     Social History Social History  Substance Use Topics  . Smoking status: Never Smoker  . Smokeless tobacco: Never Used  . Alcohol use No     Allergies   Levaquin [levofloxacin in d5w]; Macrodantin [nitrofurantoin macrocrystal]; Quinolones; Amoxicillin; Etodolac; Mometasone; Premarin [conjugated estrogens]; Vioxx [rofecoxib]; Ketek [telithromycin]; Robaxin [methocarbamol]; Aspartame and phenylalanine; Augmentin [amoxicillin-pot clavulanate]; Celebrex [celecoxib]; Cephalexin; Ciprofloxacin; Clindamycin/lincomycin; Codeine sulfate; Demerol [meperidine]; Prilosec [omeprazole]; and Septra [sulfamethoxazole-trimethoprim]   Review of Systems Review of Systems  Constitutional: Positive for chills. Negative for fever.  Respiratory: Negative for cough.   Gastrointestinal: Positive for vomiting. Negative for abdominal pain and diarrhea.  Musculoskeletal: Positive for myalgias.  Neurological: Negative for headaches.     Physical Exam Updated Vital Signs BP 125/63 (BP Location: Left Arm)   Pulse 75   Temp 97.7 F (36.5 C) (Oral)   Resp 16   Ht 5\' 8"  (1.727 m)   Wt 168 lb (76.2 kg)   SpO2 99%   BMI 25.54 kg/m   Physical Exam  Constitutional: She is oriented to person, place, and time. She appears well-developed and well-nourished. No distress.  HENT:  Head: Normocephalic and atraumatic.  Nose: Nose normal. No mucosal  edema or sinus tenderness. Right sinus exhibits no maxillary sinus tenderness and no frontal sinus tenderness. Left sinus exhibits no maxillary sinus tenderness and no frontal sinus tenderness.  Mouth/Throat: Mucous membranes are normal. No tonsillar exudate.  Post nasal drip  Eyes: Conjunctivae and EOM are normal. Pupils are equal, round, and reactive to light. Right eye exhibits no discharge. Left eye exhibits no discharge. No scleral icterus.  Neck: Normal range of motion. Neck supple.  Cardiovascular: Normal rate and regular rhythm.  Exam reveals no gallop and no friction rub.   No murmur heard. Pulmonary/Chest: Effort normal and breath sounds normal. No stridor. No respiratory distress. She has no rales.  Abdominal: Soft. She exhibits no distension. There is no tenderness.  Musculoskeletal: She exhibits no edema or tenderness.  Neurological: She is alert and oriented to person, place, and time.  Skin: Skin is warm and dry. No rash noted. She is not  diaphoretic. No erythema.  Psychiatric: She has a normal mood and affect.  Vitals reviewed.    ED Treatments / Results  Labs (all labs ordered are listed, but only abnormal results are displayed) Labs Reviewed  CBC WITH DIFFERENTIAL/PLATELET - Abnormal; Notable for the following:       Result Value   RBC 3.80 (*)    Hemoglobin 11.9 (*)    HCT 35.2 (*)    Platelets 469 (*)    All other components within normal limits  COMPREHENSIVE METABOLIC PANEL - Abnormal; Notable for the following:    Alkaline Phosphatase 146 (*)    All other components within normal limits  LIPASE, BLOOD  INFLUENZA PANEL BY PCR (TYPE A & B)    EKG  EKG Interpretation None       Radiology No results found.  Procedures Procedures (including critical care time)  Medications Ordered in ED Medications - No data to display   Initial Impression / Assessment and Plan / ED Course  I have reviewed the triage vital signs and the nursing  notes.  Pertinent labs & imaging results that were available during my care of the patient were reviewed by me and considered in my medical decision making (see chart for details).     The patient appears well, in no acute distress, without evidence of toxicity or dehydration. They are interactive and following commands. Exam benign not suggestive of sinusitis, pharyngitis, meningitis, acute abdomen. Labs grossly reassuring w/o evidence of biliary obstruction, pancreatitis. Influenza negative. Likely viral process. Able to tolerate PO challenge here w/o antiemetics given. The patient is safe for discharge with strict return precautions.    Final Clinical Impressions(s) / ED Diagnoses   Final diagnoses:  Nasal drainage  Myalgia  Non-intractable vomiting with nausea, unspecified vomiting type   Disposition: Discharge  Condition: Good  I have discussed the results, Dx and Tx plan with the patient who expressed understanding and agree(s) with the plan. Discharge instructions discussed at great length. The patient was given strict return precautions who verbalized understanding of the instructions. No further questions at time of discharge.    New Prescriptions   No medications on file    Follow Up: Johny BlamerWilliam Harris, MD [address] [place] [address] [phone]  Schedule an appointment as soon as possible for a visit  in 3-5 days, If symptoms do not improve or  worsen      Nira ConnPedro Eduardo Cardama, MD 01/09/17 1101

## 2017-01-09 NOTE — ED Triage Notes (Signed)
Patient reports that she vomited x 3 this AM. Patient denies any abdominal pain. Patient states she recently had hip surgery and has been having green thick mucus from the back of her throat daily and having generalized body aches.

## 2017-01-12 ENCOUNTER — Other Ambulatory Visit: Payer: Self-pay

## 2017-01-12 NOTE — Patient Outreach (Signed)
[facility] Care Management  01/12/2017  Cheryl SleightSharye J Nash 1941-07-21 [id number]  REFERRAL DATE: 12/25/16 REFERRAL SOURCE: Post hospital discharge 12/24/16 referral  REFERRAL REASON: Transition of care follow up CONSENT: Patient gave verbal consent for transition of care follow up calls with RNCM   PROVIDERS:  Dr. Johny BlamerWilliam Harris - primary MD Dr. Marcene CorningPeter Dalldorf - orthopedic surgeon  SOCIAL: Patient lives with spouse  SUBJECTIVE; Telephone call to patient.  HIPAA verified with patient. Patient states she is doing great. Patient states she was recently seen in the emergency room for vomiting. Patient states she is doing much better. Patient states she thinks she may have had a virus.   Patient states she is doing very well since her hip surgery. Patient reports her incision has healed completely. Patient states she does not use any ambulatory devices to walk with.  Patient states she is able to walk up stair. Patient states she is still having physical therapy. Patient states she has a follow up appointment with the orthopedic surgeon, Dr. Jerl Santosalldorf on 01/16/17.  Patient states she is going to ask if she can start back walking with her husband.  RNCM advised patient to take her medications as prescribed. Patient states she is taking her medications as prescribed. RNCM advised patient to keep follow up appointments with her doctors. Advised to call doctor for any new onset of symptoms.   Patient reports she has a physical scheduled with her primary MD in April 2018. Patient states she doesn't have any needs or concerns at this time.  Patient verbally agreed to next telephone follow up with RNCM. RNCM inquired if patient had received the [facility] care [facility] with consent form. Patient states she has not received.  Informed patient of 24 hour nurse advise line.  ASSESSMENT: patient continues to self manage care. Patient continues with physical therapy.  Has follow  up with orthopedic surgeon on 01/16/17.   PLAN: RNCM will follow up with patient within 1 week.  RNCM will [facility]end [facility] / consent.   George InaDavina Janye Maynor RN,BSN,CCM [facility] Telephonic  [phone]

## 2017-01-14 DIAGNOSIS — Z471 Aftercare following joint replacement surgery: Secondary | ICD-10-CM | POA: Diagnosis not present

## 2017-01-14 DIAGNOSIS — R262 Difficulty in walking, not elsewhere classified: Secondary | ICD-10-CM | POA: Diagnosis not present

## 2017-01-14 DIAGNOSIS — M25552 Pain in left hip: Secondary | ICD-10-CM | POA: Diagnosis not present

## 2017-01-14 DIAGNOSIS — Z96642 Presence of left artificial hip joint: Secondary | ICD-10-CM | POA: Diagnosis not present

## 2017-01-16 DIAGNOSIS — M25552 Pain in left hip: Secondary | ICD-10-CM | POA: Diagnosis not present

## 2017-02-05 IMAGING — RF DG ESOPHAGUS
10 of 13 series · 15 of 24 positions shown · non-contrast
Comparison: 11/13/2006

CLINICAL DATA: Dysphagia.  Food sticking in the esophagus.

EXAM:
ESOPHOGRAM/BARIUM SWALLOW
TECHNIQUE: Combined double contrast and single contrast examination performed
using effervescent crystals, thick barium liquid, and thin barium
liquid.
FLUOROSCOPY TIME:  Radiation Exposure Index (as provided by the
fluoroscopic device): 45.1 mGy
If the device does not provide the exposure index:
Fluoroscopy Time:  2 minutes and 18 seconds
Number of Acquired Images:

[Series 1: cp_standard · 0.34mm/px · 2 of 11 frames shown (1 of 10)]
[frame 2/11]
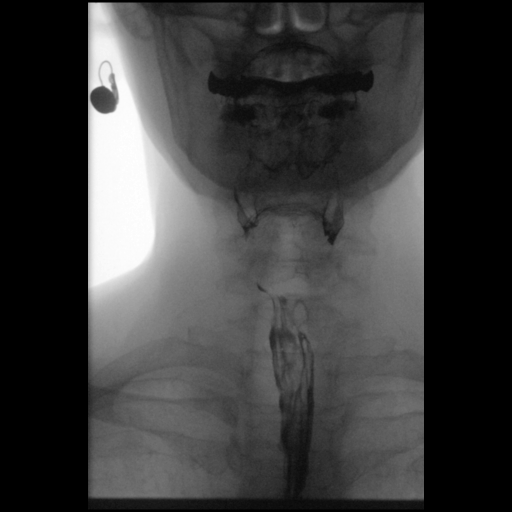
[frame 10/11]
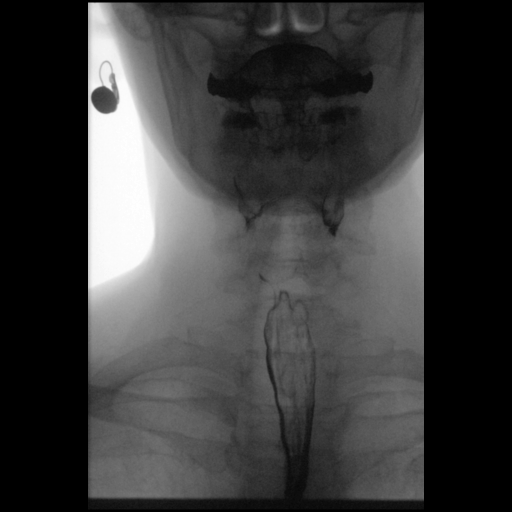

[Series 2: cp_standard · 0.34mm/px · 2 of 51 frames shown (2 of 10)]
[frame 20/51]
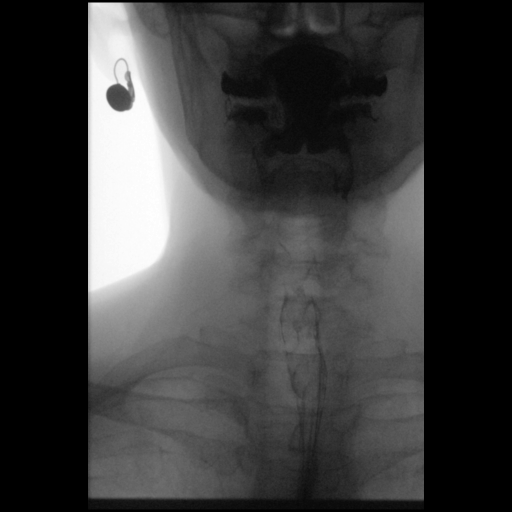
[frame 26/51]
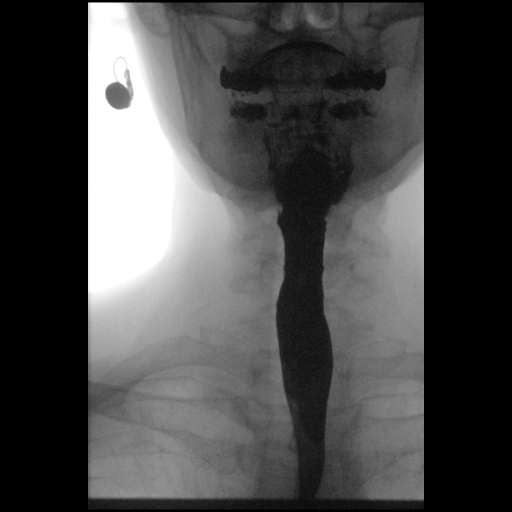

[Series 3: cp_standard · 0.34mm/px · 2 of 48 frames shown (3 of 10)]
[frame 18/48]
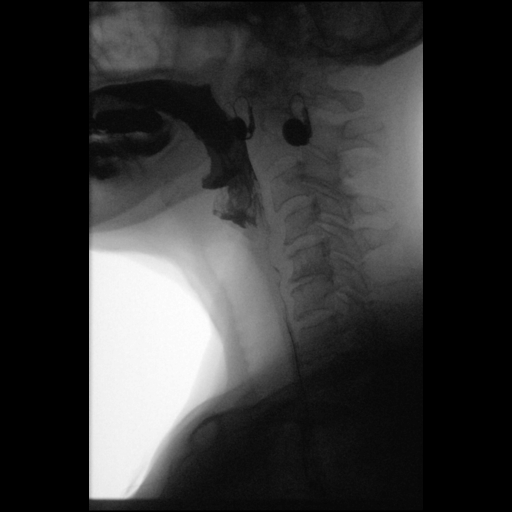
[frame 25/48]
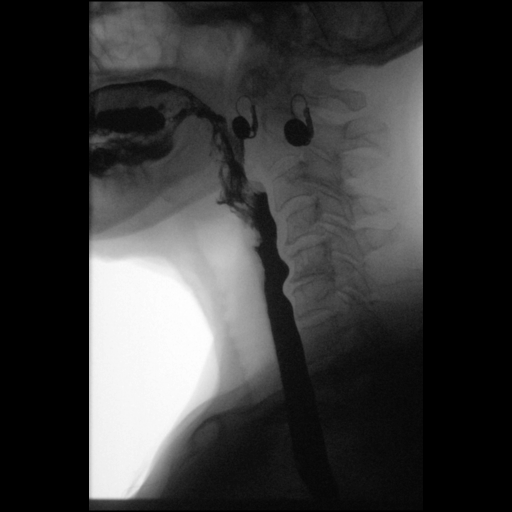

[Series 4: cp_standard · 0.35mm/px · 2 of 75 frames shown (4 of 10)]
[frame 12/75]
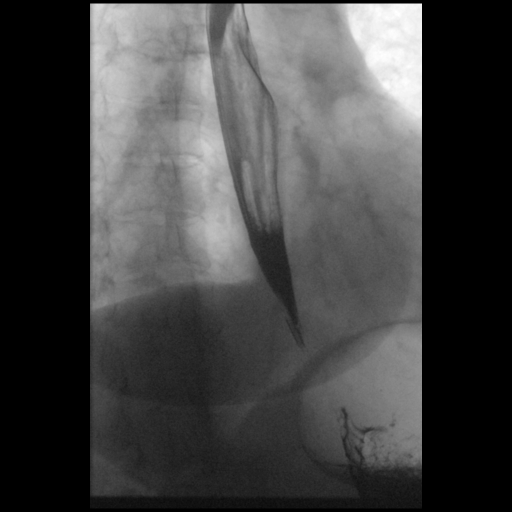
[frame 64/75]
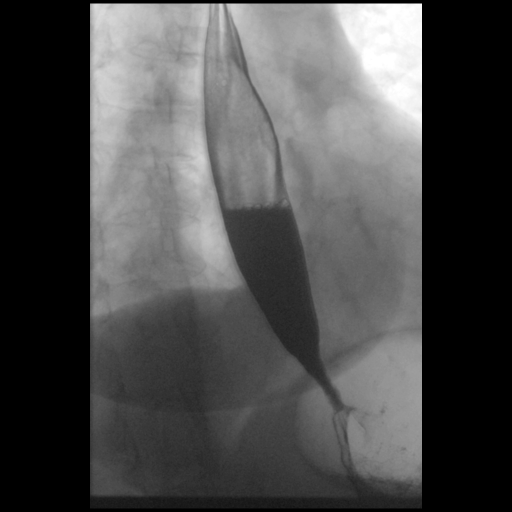

[Series 5: cp_standard · 0.17mm/px · 1 of 1 slices shown (5 of 10)]
[im 1/1]
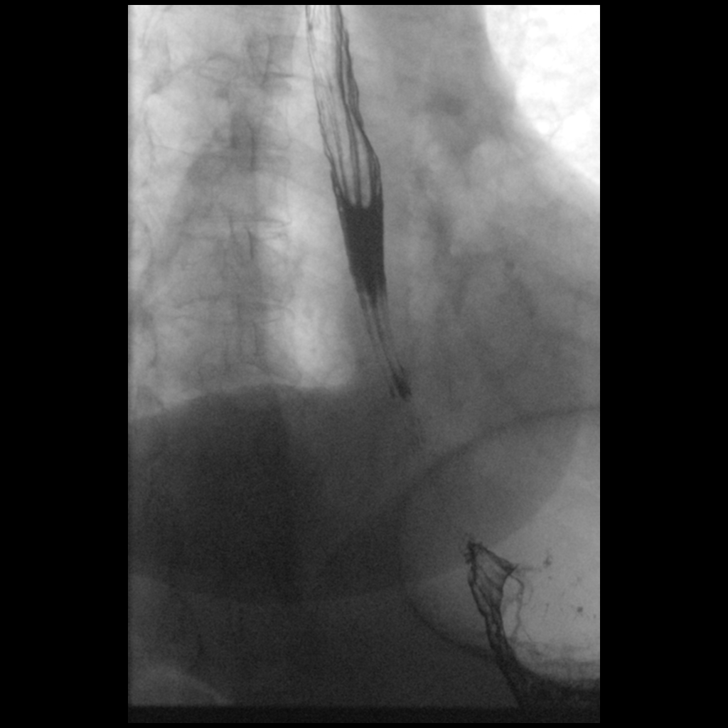

[Series 6: cp_standard · 0.34mm/px · 2 of 74 frames shown (6 of 10)]
[frame 38/74]
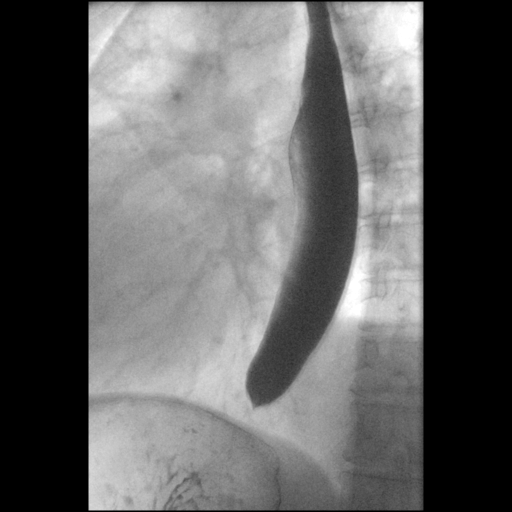
[frame 63/74]
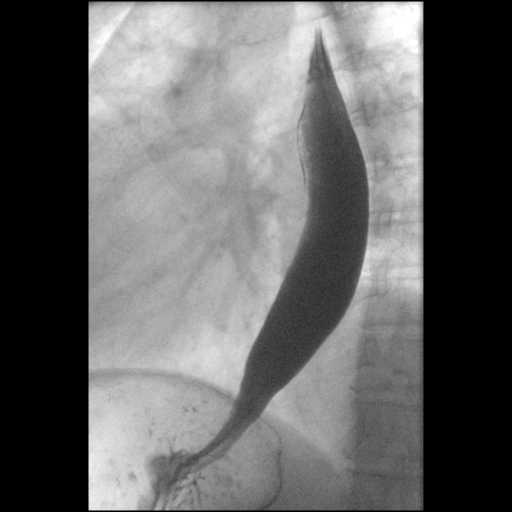

[Series 9: cp_standard · 0.17mm/px · 1 of 1 slices shown (7 of 10)]
[im 1/1]
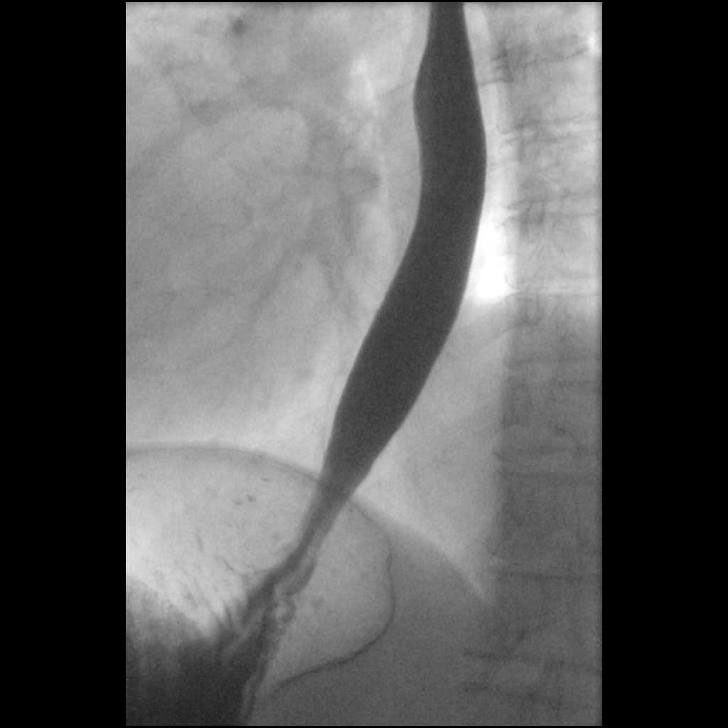

[Series 11: cp_standard · 0.17mm/px · 1 of 1 slices shown (8 of 10)]
[im 1/1]
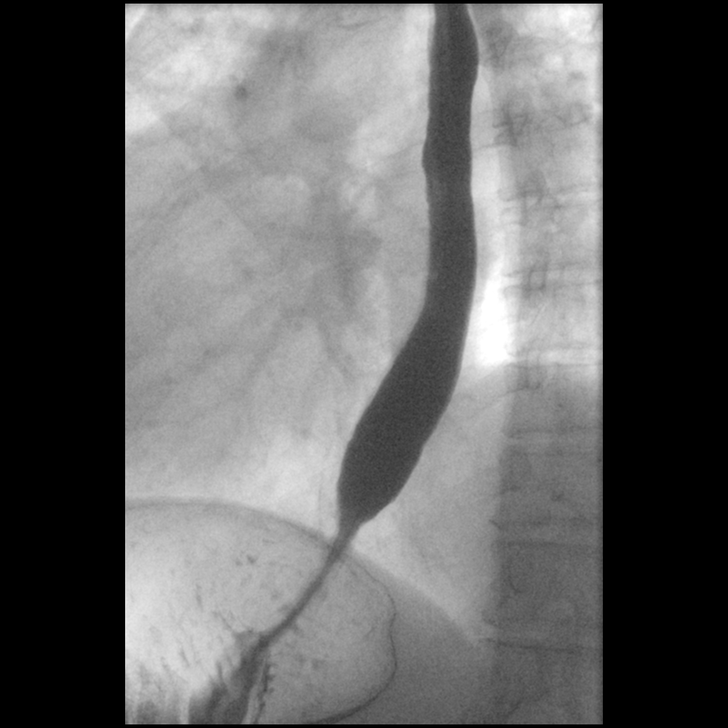

[Series 12: cp_standard · 0.17mm/px · 1 of 1 slices shown (9 of 10)]
[im 1/1]
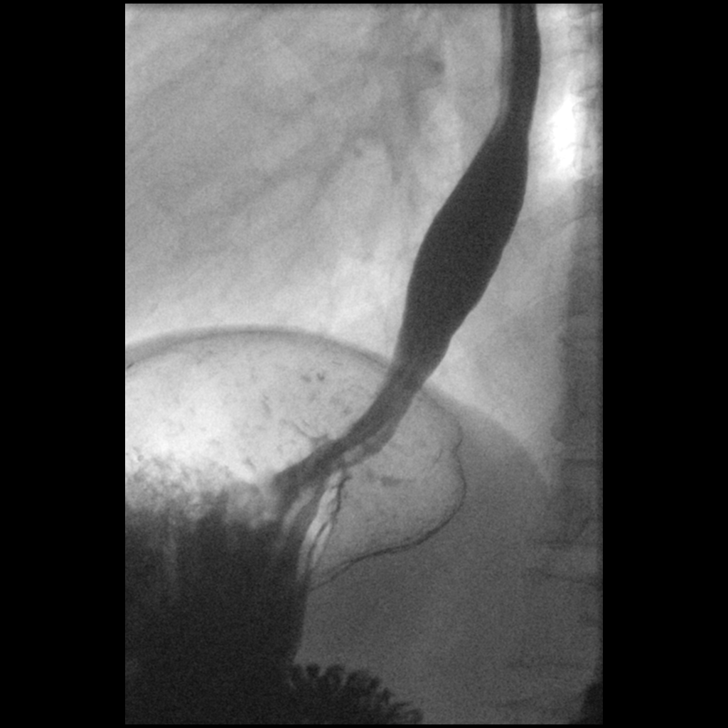

[Series 15: cp_standard · 0.18mm/px · 1 of 1 slices shown (10 of 10)]
[im 1/1]
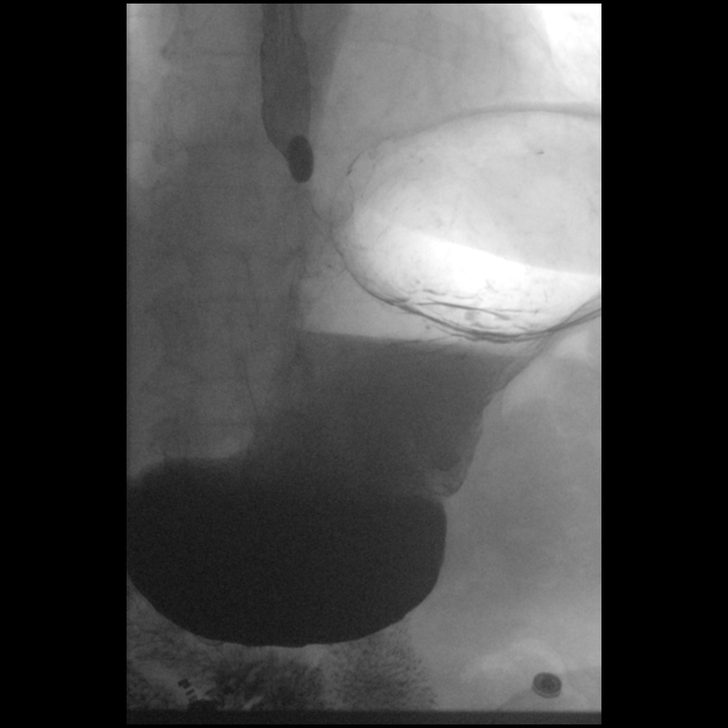

[15 of 24 positions shown; findings below may reference images not displayed]

FINDINGS: Initial barium swallows demonstrate normal pharyngeal motion with
swallowing. No laryngeal penetration or aspiration. No upper
esophageal webs, strictures or diverticuli. Mild impression on the
posterior aspect of the esophagus at C4-5 and C5-6 due to
degenerative spurring changes.

Normal esophageal motility. No intrinsic or extrinsic lesions of the
esophagus. No mucosal abnormalities.

Smooth distal strictured narrowing of the distal esophagus. No mass.
No hiatal hernia.

The 13 mm barium pill would not pass through the GE junction.
IMPRESSION: Smooth distal strictured narrowing of the esophagus consistent with
a reflux stricture. No findings suspicious for a mass. The 13 mm
barium pill would not pass into the stomach.

Normal esophageal motility.  No hiatal hernia or reflux.

## 2017-02-26 DIAGNOSIS — E039 Hypothyroidism, unspecified: Secondary | ICD-10-CM | POA: Diagnosis not present

## 2017-02-26 DIAGNOSIS — R1310 Dysphagia, unspecified: Secondary | ICD-10-CM | POA: Diagnosis not present

## 2017-02-26 DIAGNOSIS — E7800 Pure hypercholesterolemia, unspecified: Secondary | ICD-10-CM | POA: Diagnosis not present

## 2017-02-26 DIAGNOSIS — Z0000 Encounter for general adult medical examination without abnormal findings: Secondary | ICD-10-CM | POA: Diagnosis not present

## 2017-02-26 DIAGNOSIS — M810 Age-related osteoporosis without current pathological fracture: Secondary | ICD-10-CM | POA: Diagnosis not present

## 2017-03-02 ENCOUNTER — Other Ambulatory Visit: Payer: Self-pay

## 2017-03-02 NOTE — Patient Outreach (Signed)
[facility]) Care Management  03/02/2017  Cheryl Nash 1941-03-02 [id number]   REFERRAL DATE: 12/25/16 REFERRAL SOURCE: Post hospital discharge 12/24/16 referral  REFERRAL REASON: Transition of care follow up CONSENT: Patient gave verbal consent for transition of care follow up calls with RNCM   PROVIDERS:  Dr. Johny Blamer - primary MD Dr. Marcene Corning - orthopedic surgeon  SOCIAL: Patient lives with spouse  Telephone call to patient for follow up. Unable to reach patient. HIPAA compliant voice message left with call back phone number.   PLAN; RNCM will attempt 2nd telephone outreach to patient within 1 week.  George Ina RN,BSN,CCM [facility] Telephonic  [phone]

## 2017-03-06 ENCOUNTER — Other Ambulatory Visit: Payer: Self-pay

## 2017-03-06 NOTE — Patient Outreach (Signed)
[facility] Care Management  03/06/2017  Cheryl SleightSharye J Nash 1940-11-25 [id number]  REFERRAL DATE: 12/25/16 REFERRAL SOURCE: Post hospital discharge 12/24/16 referral  REFERRAL REASON: Telephone assessment follow up CONSENT: Patient gave verbal consent for transition of care follow up calls with RNCM   PROVIDERS:  Dr. Johny BlamerWilliam Harris - primary MD Dr. Marcene CorningPeter Dalldorf - orthopedic surgeon  SOCIAL: Patient lives with spouse  Outreach attempt #2 Telephone call to patient regarding telephone assessment follow up. HIPAA compliant voice message left with call back phone number.   PLAN: RNCM will attempt 3rd telephone outreach to patient within 2 weeks.   George InaDavina Shikha Bibb RN,BSN,CCM [facility] Telephonic  [phone]

## 2017-03-09 ENCOUNTER — Other Ambulatory Visit: Payer: Self-pay

## 2017-03-09 NOTE — Patient Outreach (Signed)
[facility]) Care Management  03/09/2017  DE LIBMAN [birth year] [id number]   REFERRAL DATE: 12/25/16 REFERRAL SOURCE: Post hospital discharge 12/24/16 referral  REFERRAL REASON: Transition of care follow up CONSENT: Patient gave verbal consent for transition of care follow up calls with RNCM   PROVIDERS:  Dr. Shirline Frees - primary MD Dr. Melrose Nakayama - orthopedic surgeon  SOCIAL: Patient lives with spouse  Telephone call to patient for follow up. HIPAA verified with patient. Patient states she is doing well. Patient reports she is back to walking 3 miles per day on the trail. Patient states she is pain free.  Patient reports she has 1 more follow up appointment with her orthopedic doctor on Wednesday 03/11/17. Patient states she had a physical with her primary MD on 02/03/17. Patient reports her lab work showed she was hypothyroid.  Patient states she has been on thyroid medication for several years and has been regulated. Patient states her doctor is going to recheck her thyroid level in 3 months.  Patient denies any further needs at this time. RNCM advised patient to notify MD of any changes in condition prior to scheduled appointment. RNCM provided contact name and number: [phone] or main office number [phone] and 24 hour nurse advise line [phone].  RNCM verified patient aware of 911 services for urgent/ emergent needs.Patient verbally agreed to closure with [facility] care management services at this time.   PLAN: RNCM will refer patient to care management assistant to close due to goals being met RNCM will notify patient primary MD of closure RNCM will send patient closure letter.   Quinn Plowman RN,BSN,CCM [facility] Telephonic  [phone]

## 2017-03-11 DIAGNOSIS — M25552 Pain in left hip: Secondary | ICD-10-CM | POA: Diagnosis not present

## 2017-03-13 ENCOUNTER — Ambulatory Visit: Payer: Self-pay

## 2017-04-29 ENCOUNTER — Ambulatory Visit: Payer: Medicare HMO | Admitting: Gastroenterology

## 2017-06-04 DIAGNOSIS — E039 Hypothyroidism, unspecified: Secondary | ICD-10-CM | POA: Diagnosis not present

## 2017-06-11 DIAGNOSIS — M25552 Pain in left hip: Secondary | ICD-10-CM | POA: Diagnosis not present

## 2017-06-30 DIAGNOSIS — L245 Irritant contact dermatitis due to other chemical products: Secondary | ICD-10-CM | POA: Diagnosis not present

## 2017-07-10 DIAGNOSIS — R55 Syncope and collapse: Secondary | ICD-10-CM | POA: Diagnosis not present

## 2017-08-14 DIAGNOSIS — M25562 Pain in left knee: Secondary | ICD-10-CM | POA: Diagnosis not present

## 2017-08-14 DIAGNOSIS — M1711 Unilateral primary osteoarthritis, right knee: Secondary | ICD-10-CM | POA: Diagnosis not present

## 2017-08-14 DIAGNOSIS — M1712 Unilateral primary osteoarthritis, left knee: Secondary | ICD-10-CM | POA: Diagnosis not present

## 2017-09-01 DIAGNOSIS — J0100 Acute maxillary sinusitis, unspecified: Secondary | ICD-10-CM | POA: Diagnosis not present

## 2017-12-16 DIAGNOSIS — Z96642 Presence of left artificial hip joint: Secondary | ICD-10-CM | POA: Diagnosis not present

## 2017-12-16 DIAGNOSIS — M25552 Pain in left hip: Secondary | ICD-10-CM | POA: Diagnosis not present

## 2017-12-16 DIAGNOSIS — Z09 Encounter for follow-up examination after completed treatment for conditions other than malignant neoplasm: Secondary | ICD-10-CM | POA: Diagnosis not present

## 2018-01-19 DIAGNOSIS — S20212A Contusion of left front wall of thorax, initial encounter: Secondary | ICD-10-CM | POA: Diagnosis not present

## 2018-01-29 ENCOUNTER — Other Ambulatory Visit: Payer: Self-pay | Admitting: Family Medicine

## 2018-01-29 DIAGNOSIS — Z1231 Encounter for screening mammogram for malignant neoplasm of breast: Secondary | ICD-10-CM

## 2018-02-24 ENCOUNTER — Ambulatory Visit
Admission: RE | Admit: 2018-02-24 | Discharge: 2018-02-24 | Disposition: A | Discharge status: Home / Self Care | Payer: Medicare HMO | Source: Ambulatory Visit | Attending: Family Medicine | Admitting: Family Medicine

## 2018-02-24 DIAGNOSIS — Z1231 Encounter for screening mammogram for malignant neoplasm of breast: Secondary | ICD-10-CM | POA: Diagnosis not present

## 2018-03-02 DIAGNOSIS — E7800 Pure hypercholesterolemia, unspecified: Secondary | ICD-10-CM | POA: Diagnosis not present

## 2018-03-02 DIAGNOSIS — M810 Age-related osteoporosis without current pathological fracture: Secondary | ICD-10-CM | POA: Diagnosis not present

## 2018-03-02 DIAGNOSIS — N952 Postmenopausal atrophic vaginitis: Secondary | ICD-10-CM | POA: Diagnosis not present

## 2018-03-02 DIAGNOSIS — E039 Hypothyroidism, unspecified: Secondary | ICD-10-CM | POA: Diagnosis not present

## 2018-03-02 DIAGNOSIS — Z0000 Encounter for general adult medical examination without abnormal findings: Secondary | ICD-10-CM | POA: Diagnosis not present

## 2018-03-02 DIAGNOSIS — L219 Seborrheic dermatitis, unspecified: Secondary | ICD-10-CM | POA: Diagnosis not present

## 2018-03-26 DIAGNOSIS — H52203 Unspecified astigmatism, bilateral: Secondary | ICD-10-CM | POA: Diagnosis not present

## 2018-03-26 DIAGNOSIS — Z961 Presence of intraocular lens: Secondary | ICD-10-CM | POA: Diagnosis not present

## 2018-05-27 DIAGNOSIS — J320 Chronic maxillary sinusitis: Secondary | ICD-10-CM | POA: Diagnosis not present

## 2018-05-27 DIAGNOSIS — H608X3 Other otitis externa, bilateral: Secondary | ICD-10-CM | POA: Diagnosis not present

## 2018-05-27 DIAGNOSIS — J3489 Other specified disorders of nose and nasal sinuses: Secondary | ICD-10-CM | POA: Diagnosis not present

## 2018-06-15 DIAGNOSIS — H6122 Impacted cerumen, left ear: Secondary | ICD-10-CM | POA: Diagnosis not present

## 2018-06-15 DIAGNOSIS — J302 Other seasonal allergic rhinitis: Secondary | ICD-10-CM | POA: Diagnosis not present

## 2018-07-27 ENCOUNTER — Encounter: Payer: Self-pay | Admitting: Gastroenterology

## 2018-07-27 ENCOUNTER — Encounter (INDEPENDENT_AMBULATORY_CARE_PROVIDER_SITE_OTHER): Payer: Self-pay

## 2018-07-27 ENCOUNTER — Ambulatory Visit: Payer: Medicare HMO | Admitting: Gastroenterology

## 2018-07-27 VITALS — BP 122/70 | HR 64 | Ht 67.25 in | Wt 169.4 lb

## 2018-07-27 DIAGNOSIS — Z1212 Encounter for screening for malignant neoplasm of rectum: Secondary | ICD-10-CM | POA: Diagnosis not present

## 2018-07-27 DIAGNOSIS — Z8719 Personal history of other diseases of the digestive system: Secondary | ICD-10-CM | POA: Diagnosis not present

## 2018-07-27 DIAGNOSIS — Z1211 Encounter for screening for malignant neoplasm of colon: Secondary | ICD-10-CM

## 2018-07-27 DIAGNOSIS — R1319 Other dysphagia: Secondary | ICD-10-CM

## 2018-07-27 DIAGNOSIS — Z9889 Other specified postprocedural states: Secondary | ICD-10-CM | POA: Diagnosis not present

## 2018-07-27 DIAGNOSIS — R1310 Dysphagia, unspecified: Secondary | ICD-10-CM | POA: Diagnosis not present

## 2018-07-27 NOTE — Patient Instructions (Signed)
You have been scheduled for an endoscopy. Please follow written instructions given to you at your visit today. If you use inhalers (even only as needed), please bring them with you on the day of your procedure. Your physician has requested that you go to www.startemmi.com and enter the access code given to you at your visit today. This web site gives a general overview about your procedure. However, you should still follow specific instructions given to you by our office regarding your preparation for the procedure.  Normal BMI (Body Mass Index- based on height and weight) is between 23 and 30. Your BMI today is Body mass index is 26.33 kg/m. Marland Kitchen. Please consider follow up  regarding your BMI with your Primary Care Provider.  Thank you for choosing me and [facility].  Venita LickMalcolm T. Pleas KochStark, Jr., MD., Clementeen GrahamFACG

## 2018-07-27 NOTE — Progress Notes (Signed)
    History of Present Illness: This is a 77 year old female with recurrent dysphagia.  She is accompanied by her husband.  She has a history of an esophageal stricture.  She relates progressively worsening solid food dysphagia over the past month.  She notes that sometimes an impacted food bolus will pass after taking nitroglycerin.  Symptoms are now occurring a couple times per week primarily with breads and meats.  She denies heartburn symptoms and is not taking acid reducing medications.  EGD 06/2016  - Benign-appearing esophageal stenosis. Dilated. - Normal stomach. - Normal. - No specimens collected.  Current Medications, Allergies, Past Medical History, Past Surgical History, Family History and Social History were reviewed in [facility] electronic medical record.  Physical Exam: General: Well developed, well nourished, no acute distress Head: Normocephalic and atraumatic Eyes:  sclerae anicteric, EOMI Ears: Normal auditory acuity Mouth: No deformity or lesions Lungs: Clear throughout to auscultation Heart: Regular rate and rhythm; no murmurs, rubs or bruits Abdomen: Soft, non tender and non distended. No masses, hepatosplenomegaly or hernias noted. Normal Bowel sounds Rectal: Not done Musculoskeletal: Symmetrical with no gross deformities  Pulses:  Normal pulses noted Extremities: No clubbing, cyanosis, edema or deformities noted Neurological: Alert oriented x 4, grossly nonfocal Psychological:  Alert and cooperative. Normal mood and affect   Assessment and Recommendations:  1. Dysphagia.  Suspected recurrent esophageal stricture likely due to chronic GERD.  Begin omeprazole 20 mg daily long-term.  Schedule EGD with possible dilation. The risks (including bleeding, perforation, infection, missed lesions, medication reactions and possible hospitalization or surgery if complications occur), benefits, and alternatives to endoscopy with possible biopsy and possible dilation were  discussed with the patient and they consent to proceed.   2. CRC screening, average risk.  She is due for 10-year interval colonoscopy however she declines to schedule at this time. She prefers to schedule colonoscopy after her dysphagia has resolved.

## 2018-07-30 ENCOUNTER — Encounter: Payer: Self-pay | Admitting: Gastroenterology

## 2018-08-13 ENCOUNTER — Encounter: Payer: Self-pay | Admitting: Gastroenterology

## 2018-08-13 ENCOUNTER — Ambulatory Visit (AMBULATORY_SURGERY_CENTER): Payer: Medicare HMO | Admitting: Gastroenterology

## 2018-08-13 VITALS — BP 113/78 | HR 54 | Temp 96.9°F | Resp 25 | Ht 67.0 in | Wt 169.0 lb

## 2018-08-13 DIAGNOSIS — K259 Gastric ulcer, unspecified as acute or chronic, without hemorrhage or perforation: Secondary | ICD-10-CM | POA: Diagnosis not present

## 2018-08-13 DIAGNOSIS — K219 Gastro-esophageal reflux disease without esophagitis: Secondary | ICD-10-CM | POA: Diagnosis not present

## 2018-08-13 DIAGNOSIS — R1310 Dysphagia, unspecified: Secondary | ICD-10-CM

## 2018-08-13 DIAGNOSIS — K222 Esophageal obstruction: Secondary | ICD-10-CM

## 2018-08-13 DIAGNOSIS — R1319 Other dysphagia: Secondary | ICD-10-CM

## 2018-08-13 DIAGNOSIS — K3189 Other diseases of stomach and duodenum: Secondary | ICD-10-CM | POA: Diagnosis not present

## 2018-08-13 MED ORDER — SODIUM CHLORIDE 0.9 % IV SOLN: 500.0000 mL | Freq: Once | INTRAVENOUS | Status: DC

## 2018-08-13 MED ORDER — PANTOPRAZOLE SODIUM 40 MG PO TBEC: 40.0000 mg | DELAYED_RELEASE_TABLET | Freq: Every day | ORAL | 3 refills | Status: AC

## 2018-08-13 NOTE — Patient Instructions (Addendum)
YOU HAD AN ENDOSCOPIC PROCEDURE TODAY AT THE [facility]:   Refer to the procedure report that was given to you for any specific questions about what was found during the examination.  If the procedure report does not answer your questions, please call your gastroenterologist to clarify.  If you requested that your care partner not be given the details of your procedure findings, then the procedure report has been included in a sealed envelope for you to review at your convenience later.  YOU SHOULD EXPECT: Some feelings of bloating in the abdomen. Passage of more gas than usual.  Walking can help get rid of the air that was put into your GI tract during the procedure and reduce the bloating. If you had a lower endoscopy (such as a colonoscopy or flexible sigmoidoscopy) you may notice spotting of blood in your stool or on the toilet paper. If you underwent a bowel prep for your procedure, you may not have a normal bowel movement for a few days.  Please Note:  You might notice some irritation and congestion in your nose or some drainage.  This is from the oxygen used during your procedure.  There is no need for concern and it should clear up in a day or so.  SYMPTOMS TO REPORT IMMEDIATELY:  Fo  Following upper endoscopy (EGD)  Vomiting of blood or coffee ground material  New chest pain or pain under the shoulder blades  Painful or persistently difficult swallowing  New shortness of breath  Fever of 100F or higher  Black, tarry-looking stools  For urgent or emergent issues, a gastroenterologist can be reached at any hour by calling [phone].   DIET:  We do recommend a small meal at first, but then you may proceed to your regular diet.  Drink plenty of fluids but you should avoid alcoholic beverages for 24 hours.  ACTIVITY:  You should plan to take it easy for the rest of today and you should NOT DRIVE or use heavy machinery until tomorrow (because of the sedation medicines  used during the test).    FOLLOW UP: Our staff will call the number listed on your records the next business day following your procedure to check on you and address any questions or concerns that you may have regarding the information given to you following your procedure. If we do not reach you, we will leave a message.  However, if you are feeling well and you are not experiencing any problems, there is no need to return our call.  We will assume that you have returned to your regular daily activities without incident.  If any biopsies were taken you will be contacted by phone or by letter within the next 1-3 weeks.  Please call us at [phone] if you have not heard about the biopsies in 3 weeks.    SIGNATURES/CONFIDENTIALITY: You and/or your care partner have signed paperwork which will be entered into your electronic medical record.  These signatures attest to the fact that that the information above on your After Visit Summary has been reviewed and is understood.  Full responsibility of the confidentiality of this discharge information lies with you and/or your care-partner.  Clear liquids for 2 hours.    Anti reflux medications given.  See Dr. Russella Dar in 6 weeks.  Protonix, pantoprazole 40mg   Daily indefinitely.

## 2018-08-13 NOTE — Progress Notes (Signed)
Called to room to assist during endoscopic procedure.  Patient ID and intended procedure confirmed with present staff. Received instructions for my participation in the procedure from the performing physician.  

## 2018-08-13 NOTE — Progress Notes (Signed)
Report given to PACU, vss 

## 2018-08-13 NOTE — Op Note (Addendum)
[facility] Patient Name: Cheryl Nash Procedure Date: 08/13/2018 10:18 AM MRN: [id number] Endoscopist: Meryl Dare , MD Age: 77 Referring MD:  Date of Birth: 10-17-1941 Gender: Female Account #: [id number] Procedure:                Upper GI endoscopy Indications:              Dysphagia, Gastroesophageal reflux disease Medicines:                Monitored Anesthesia Care Procedure:                Pre-Anesthesia Assessment:                           - Prior to the procedure, a History and Physical                            was performed, and patient medications and                            allergies were reviewed. The patient's tolerance of                            previous anesthesia was also reviewed. The risks                            and benefits of the procedure and the sedation                            options and risks were discussed with the patient.                            All questions were answered, and informed consent                            was obtained. Prior Anticoagulants: The patient has                            taken no previous anticoagulant or antiplatelet                            agents. ASA Grade Assessment: II - A patient with                            mild systemic disease. After reviewing the risks                            and benefits, the patient was deemed in                            satisfactory condition to undergo the procedure.                           After obtaining informed consent, the endoscope was  passed under direct vision. Throughout the                            procedure, the patient's blood pressure, pulse, and                            oxygen saturations were monitored continuously. The                            Model GIF-HQ190 [id number]) scope was introduced                            through the mouth, and advanced to the second part                            of  duodenum. The upper GI endoscopy was                            accomplished without difficulty. The patient                            tolerated the procedure well. Scope In: Scope Out: Findings:                 One benign-appearing, intrinsic moderate stenosis                            was found at the gastroesophageal junction. This                            stenosis measured 1.2 cm (inner diameter). The                            stenosis was traversed. A guidewire was placed and                            the scope was withdrawn. Dilations were performed                            with Savary dilators with mild resistance at 13 mm,                            14 mm and 15 mm. Small amount of heme on last                            dilator.                           The exam of the esophagus was otherwise normal.                           A few localized, small non-bleeding erosions were                            found  on the posterior wall of the stomach. There                            were no stigmata of recent bleeding. Biopsies were                            taken with a cold forceps for histology.                           The exam of the stomach was otherwise normal.                           The duodenal bulb and second portion of the                            duodenum were normal. Complications:            No immediate complications. Estimated Blood Loss:     Estimated blood loss was minimal. Impression:               - Benign-appearing esophageal stenosis. Dilated.                           - Non-bleeding erosive gastropathy. Biopsied.                           - Normal duodenal bulb and second portion of the                            duodenum. Recommendation:           - Patient has a contact number available for                            emergencies. The signs and symptoms of potential                            delayed complications were discussed with the                             patient. Return to normal activities tomorrow.                            Written discharge instructions were provided to the                            patient.                           - Clear liquid diet for 2 hours, then advance as                            tolerated to soft diet today.                           - Antireflux measures.                           -  Continue present medications.                           - Await pathology results.                           - Return to GI office in 6 weeks.                           - Protonix (pantoprazole) 40 mg PO daily                            indefinitely, 1 year of refills Meryl Dare, MD 08/13/2018 10:47:25 AM This report has been signed electronically.

## 2018-08-16 ENCOUNTER — Telehealth: Payer: Self-pay

## 2018-08-16 NOTE — Telephone Encounter (Signed)
Left message, did not reach patient at follow up call.

## 2018-08-16 NOTE — Telephone Encounter (Signed)
  Follow up Call-  Call back number 08/13/2018 06/17/2016  Post procedure Call Back phone  # [phone]  Permission to leave phone message Yes Yes  Some recent data might be hidden     Patient questions:  Do you have a fever, pain , or abdominal swelling? No. Pain Score  0 *  Have you tolerated food without any problems? Yes.    Have you been able to return to your normal activities? Yes.    Do you have any questions about your discharge instructions: Diet   No. Medications  No. Follow up visit  No.  Do you have questions or concerns about your Care? No.  Actions: * If pain score is 4 or above: No action needed, pain <4.  No problems noted per pt. maw

## 2018-08-21 DIAGNOSIS — M25552 Pain in left hip: Secondary | ICD-10-CM | POA: Diagnosis not present

## 2018-08-30 ENCOUNTER — Encounter: Payer: Self-pay | Admitting: Gastroenterology

## 2018-09-16 DIAGNOSIS — L299 Pruritus, unspecified: Secondary | ICD-10-CM | POA: Diagnosis not present

## 2018-09-16 DIAGNOSIS — H60332 Swimmer's ear, left ear: Secondary | ICD-10-CM | POA: Diagnosis not present

## 2018-09-16 DIAGNOSIS — H938X3 Other specified disorders of ear, bilateral: Secondary | ICD-10-CM | POA: Diagnosis not present

## 2018-09-17 DIAGNOSIS — J0100 Acute maxillary sinusitis, unspecified: Secondary | ICD-10-CM | POA: Diagnosis not present

## 2018-11-07 DIAGNOSIS — J0100 Acute maxillary sinusitis, unspecified: Secondary | ICD-10-CM | POA: Diagnosis not present

## 2019-04-07 DIAGNOSIS — H52203 Unspecified astigmatism, bilateral: Secondary | ICD-10-CM | POA: Diagnosis not present

## 2019-04-07 DIAGNOSIS — Z961 Presence of intraocular lens: Secondary | ICD-10-CM | POA: Diagnosis not present

## 2019-06-29 DIAGNOSIS — R1310 Dysphagia, unspecified: Secondary | ICD-10-CM | POA: Diagnosis not present

## 2019-06-29 DIAGNOSIS — M810 Age-related osteoporosis without current pathological fracture: Secondary | ICD-10-CM | POA: Diagnosis not present

## 2019-06-29 DIAGNOSIS — E039 Hypothyroidism, unspecified: Secondary | ICD-10-CM | POA: Diagnosis not present

## 2019-06-29 DIAGNOSIS — N952 Postmenopausal atrophic vaginitis: Secondary | ICD-10-CM | POA: Diagnosis not present

## 2019-06-29 DIAGNOSIS — E7800 Pure hypercholesterolemia, unspecified: Secondary | ICD-10-CM | POA: Diagnosis not present

## 2019-06-29 DIAGNOSIS — Z0000 Encounter for general adult medical examination without abnormal findings: Secondary | ICD-10-CM | POA: Diagnosis not present

## 2019-06-29 DIAGNOSIS — Z1231 Encounter for screening mammogram for malignant neoplasm of breast: Secondary | ICD-10-CM | POA: Diagnosis not present

## 2019-07-01 ENCOUNTER — Other Ambulatory Visit: Payer: Self-pay | Admitting: Family Medicine

## 2019-07-01 DIAGNOSIS — Z1231 Encounter for screening mammogram for malignant neoplasm of breast: Secondary | ICD-10-CM

## 2019-07-04 ENCOUNTER — Other Ambulatory Visit: Payer: Self-pay | Admitting: Family Medicine

## 2019-07-04 DIAGNOSIS — M810 Age-related osteoporosis without current pathological fracture: Secondary | ICD-10-CM

## 2019-07-25 DIAGNOSIS — M810 Age-related osteoporosis without current pathological fracture: Secondary | ICD-10-CM | POA: Diagnosis not present

## 2019-07-25 DIAGNOSIS — E7800 Pure hypercholesterolemia, unspecified: Secondary | ICD-10-CM | POA: Diagnosis not present

## 2019-07-25 DIAGNOSIS — E039 Hypothyroidism, unspecified: Secondary | ICD-10-CM | POA: Diagnosis not present

## 2019-08-03 DIAGNOSIS — H6123 Impacted cerumen, bilateral: Secondary | ICD-10-CM | POA: Diagnosis not present

## 2019-10-03 DIAGNOSIS — M25562 Pain in left knee: Secondary | ICD-10-CM | POA: Diagnosis not present

## 2019-10-07 DIAGNOSIS — M25562 Pain in left knee: Secondary | ICD-10-CM | POA: Diagnosis not present

## 2019-10-10 DIAGNOSIS — M25562 Pain in left knee: Secondary | ICD-10-CM | POA: Diagnosis not present

## 2019-10-12 ENCOUNTER — Other Ambulatory Visit: Payer: Self-pay

## 2019-10-12 ENCOUNTER — Ambulatory Visit
Admission: RE | Admit: 2019-10-12 | Discharge: 2019-10-12 | Disposition: A | Discharge status: Home / Self Care | Payer: Medicare HMO | Source: Ambulatory Visit | Attending: Family Medicine | Admitting: Family Medicine

## 2019-10-12 DIAGNOSIS — Z1231 Encounter for screening mammogram for malignant neoplasm of breast: Secondary | ICD-10-CM | POA: Diagnosis not present

## 2019-10-12 DIAGNOSIS — M810 Age-related osteoporosis without current pathological fracture: Secondary | ICD-10-CM

## 2019-10-26 ENCOUNTER — Other Ambulatory Visit: Payer: Self-pay

## 2019-10-26 ENCOUNTER — Ambulatory Visit
Admission: RE | Admit: 2019-10-26 | Discharge: 2019-10-26 | Disposition: A | Discharge status: Home / Self Care | Payer: Medicare HMO | Source: Ambulatory Visit | Attending: Family Medicine | Admitting: Family Medicine

## 2019-10-26 DIAGNOSIS — Z780 Asymptomatic menopausal state: Secondary | ICD-10-CM | POA: Diagnosis not present

## 2019-10-26 DIAGNOSIS — M8589 Other specified disorders of bone density and structure, multiple sites: Secondary | ICD-10-CM | POA: Diagnosis not present

## 2019-10-27 DIAGNOSIS — S83242A Other tear of medial meniscus, current injury, left knee, initial encounter: Secondary | ICD-10-CM | POA: Diagnosis not present

## 2019-10-27 DIAGNOSIS — M25562 Pain in left knee: Secondary | ICD-10-CM | POA: Diagnosis not present

## 2019-11-09 DIAGNOSIS — M25562 Pain in left knee: Secondary | ICD-10-CM | POA: Diagnosis not present

## 2019-11-24 DIAGNOSIS — S83232A Complex tear of medial meniscus, current injury, left knee, initial encounter: Secondary | ICD-10-CM | POA: Diagnosis not present

## 2019-11-24 DIAGNOSIS — M948X6 Other specified disorders of cartilage, lower leg: Secondary | ICD-10-CM | POA: Diagnosis not present

## 2019-11-24 DIAGNOSIS — M1712 Unilateral primary osteoarthritis, left knee: Secondary | ICD-10-CM | POA: Diagnosis not present

## 2019-11-28 ENCOUNTER — Other Ambulatory Visit: Payer: Self-pay

## 2019-11-28 ENCOUNTER — Emergency Department (HOSPITAL_COMMUNITY): Payer: Medicare HMO

## 2019-11-28 ENCOUNTER — Encounter (HOSPITAL_COMMUNITY): Payer: Self-pay | Admitting: *Deleted

## 2019-11-28 ENCOUNTER — Emergency Department (HOSPITAL_COMMUNITY)
Admission: EM | Admit: 2019-11-28 | Discharge: 2019-11-28 | Discharge status: Against Medical Advice | Payer: Medicare HMO | Attending: Emergency Medicine | Admitting: Emergency Medicine

## 2019-11-28 DIAGNOSIS — R0602 Shortness of breath: Secondary | ICD-10-CM | POA: Diagnosis not present

## 2019-11-28 DIAGNOSIS — R079 Chest pain, unspecified: Secondary | ICD-10-CM | POA: Diagnosis not present

## 2019-11-28 DIAGNOSIS — Z79899 Other long term (current) drug therapy: Secondary | ICD-10-CM | POA: Insufficient documentation

## 2019-11-28 DIAGNOSIS — R0789 Other chest pain: Secondary | ICD-10-CM | POA: Diagnosis not present

## 2019-11-28 DIAGNOSIS — E039 Hypothyroidism, unspecified: Secondary | ICD-10-CM | POA: Diagnosis not present

## 2019-11-28 DIAGNOSIS — Z96642 Presence of left artificial hip joint: Secondary | ICD-10-CM | POA: Insufficient documentation

## 2019-11-28 DIAGNOSIS — R0989 Other specified symptoms and signs involving the circulatory and respiratory systems: Secondary | ICD-10-CM | POA: Diagnosis present

## 2019-11-28 DIAGNOSIS — R0902 Hypoxemia: Secondary | ICD-10-CM | POA: Diagnosis not present

## 2019-11-28 DIAGNOSIS — R1111 Vomiting without nausea: Secondary | ICD-10-CM | POA: Diagnosis not present

## 2019-11-28 DIAGNOSIS — T7840XA Allergy, unspecified, initial encounter: Secondary | ICD-10-CM | POA: Diagnosis not present

## 2019-11-28 DIAGNOSIS — R112 Nausea with vomiting, unspecified: Secondary | ICD-10-CM | POA: Diagnosis not present

## 2019-11-28 LAB — CBC WITH DIFFERENTIAL/PLATELET
Abs Immature Granulocytes: 0.07 10*3/uL (ref 0.00–0.07)
Basophils Absolute: 0 10*3/uL (ref 0.0–0.1)
Basophils Relative: 0 %
Eosinophils Absolute: 0.1 10*3/uL (ref 0.0–0.5)
Eosinophils Relative: 1 %
HCT: 46 % (ref 36.0–46.0)
Hemoglobin: 15.5 g/dL — ABNORMAL HIGH (ref 12.0–15.0)
Immature Granulocytes: 1 %
Lymphocytes Relative: 18 %
Lymphs Abs: 1.9 10*3/uL (ref 0.7–4.0)
MCH: 32.3 pg (ref 26.0–34.0)
MCHC: 33.7 g/dL (ref 30.0–36.0)
MCV: 95.8 fL (ref 80.0–100.0)
Monocytes Absolute: 0.9 10*3/uL (ref 0.1–1.0)
Monocytes Relative: 9 %
Neutro Abs: 7.3 10*3/uL (ref 1.7–7.7)
Neutrophils Relative %: 71 %
Platelets: 379 10*3/uL (ref 150–400)
RBC: 4.8 MIL/uL (ref 3.87–5.11)
RDW: 11.7 % (ref 11.5–15.5)
WBC: 10.3 10*3/uL (ref 4.0–10.5)
nRBC: 0 % (ref 0.0–0.2)

## 2019-11-28 LAB — BASIC METABOLIC PANEL
Anion gap: 9 (ref 5–15)
BUN: 16 mg/dL (ref 8–23)
CO2: 30 mmol/L (ref 22–32)
Calcium: 9.4 mg/dL (ref 8.9–10.3)
Chloride: 98 mmol/L (ref 98–111)
Creatinine, Ser: 0.89 mg/dL (ref 0.44–1.00)
GFR calc Af Amer: >60 mL/min (ref >60)
GFR calc non Af Amer: >60 mL/min (ref >60)
Glucose, Bld: 73 mg/dL (ref 70–99)
Potassium: 3.5 mmol/L (ref 3.5–5.1)
Sodium: 137 mmol/L (ref 135–145)

## 2019-11-28 LAB — TROPONIN I (HIGH SENSITIVITY): Troponin I (High Sensitivity): 9 ng/L (ref <18)

## 2019-11-28 NOTE — ED Provider Notes (Signed)
[facility] DEPT Provider Note   CSN: [id number] Arrival date & time: 11/28/19  1322     History Chief Complaint  Patient presents with  . Allergic Reaction    Cheryl Nash is a 79 y.o. female.  Patient arrives via EMS with possible allergic reaction.  States she ate some homemade soup and immediately while she was eating it she began to feel tight in her throat and felt like her "esophagus closed off".  She developed a rash to her arms that was itchy and felt tight in her chest with some shortness of breath.  She did have 1 episode of vomiting and now feels better.  She was given Benadryl by EMS and feels back to baseline.  She is never had this kind of reaction before.  Denies any tongue lip swelling.  Denies any chest pain or shortness of breath currently.  Denies any cardiac history. Did have arthroscopic knee surgery last week but reports this was doing well and she takes no new medications.  She feels at her baseline now with no difficulty breathing or difficulty swallowing.  No chest pain or shortness of breath.  The history is provided by the patient and the EMS personnel.  Allergic Reaction Presenting symptoms: no rash        Past Medical History:  Diagnosis Date  . Allergic rhinitis   . Chronic constipation   . Hepatitis    2003   TOXIC HEPATITIS  . Histoplasmosis 1972  . Hyperlipidemia   . Hypoglycemia   . Hypothyroidism   . MVP (mitral valve prolapse)   . Osteoporosis   . Pancreatitis 2001  . Pneumonia [phone]  . Psoriasis     Patient Active Problem List   Diagnosis Date Noted  . Primary osteoarthritis of left hip 12/23/2016    Past Surgical History:  Procedure Laterality Date  . ABDOMINAL HYSTERECTOMY    . APPENDECTOMY    . CATARACT EXTRACTION Bilateral 11/2015,12/2015  . CHOLECYSTECTOMY    . ESOPHAGEAL DILATION    . KNEE ARTHROSCOPY    . LIVER RESECTION    . LUMBAR DISC SURGERY  05/2000   L4-5  . LYSIS OF  ADHESION  2001  . pancreatic duct  1985   enlargement of duct  . TONSILLECTOMY AND ADENOIDECTOMY  1948  . TOTAL ABDOMINAL HYSTERECTOMY W/ BILATERAL SALPINGOOPHORECTOMY  1970  . TOTAL HIP ARTHROPLASTY Left 12/23/2016   Procedure: TOTAL HIP ARTHROPLASTY ANTERIOR APPROACH;  Surgeon: Melrose Nakayama, MD;  Location: [facility];  Service: Orthopedics;  Laterality: Left;     OB History   No obstetric history on file.     Family History  Problem Relation Age of Onset  . Heart attack Mother   . CAD Mother   . Colon cancer Maternal Grandfather   . Aneurysm Maternal Grandmother     Social History   Tobacco Use  . Smoking status: Never Smoker  . Smokeless tobacco: Never Used  Substance Use Topics  . Alcohol use: No    Alcohol/week: 0.0 standard drinks  . Drug use: No    Home Medications Prior to Admission medications   Medication Sig Start Date End Date Taking? Authorizing Provider  aspirin EC 325 MG tablet Take 325-650 mg by mouth as needed for mild pain, moderate pain or fever.     [provider]  Calcium Citrate (CITRACAL PO) Take 1 tablet by mouth daily.    [provider]  chlorpheniramine (CHLOR-TRIMETON) 4 MG tablet Take  4 mg by mouth every 4 (four) hours as needed for allergies.    [provider]  cyanocobalamin 1000 MCG tablet Take 1,000 mcg by mouth daily.    [provider]  EPINEPHrine (EPIPEN 2-PAK) 0.3 mg/0.3 mL IJ SOAJ injection Inject 0.3 mg into the muscle once as needed (for allergic reaction).     [provider]  estradiol (ESTRACE) 0.1 MG/GM vaginal cream Place 1 Applicatorful vaginally 2 (two) times a week.     [provider]  levothyroxine (SYNTHROID, LEVOTHROID) 112 MCG tablet Take 112 mcg by mouth daily before breakfast.    [provider]  liothyronine (CYTOMEL) 5 MCG tablet Take 5 mcg by mouth daily.    [provider]  Multiple Vitamin (MULTIVITAMIN) tablet Take 1 tablet by mouth daily.  Centrum silver    [provider]  nitroGLYCERIN (NITROSTAT) 0.4 MG SL tablet Place 0.4 mg under the tongue every 5 (five) minutes as needed for chest pain.    [provider]  Omega-3 Fatty Acids (FISH OIL) 1200 MG CAPS Take 1 capsule by mouth daily.    [provider]  pantoprazole (PROTONIX) 40 MG tablet Take 1 tablet (40 mg total) by mouth daily. 08/13/18   Meryl Dare, MD    Allergies    Levaquin [levofloxacin in d5w], Macrodantin [nitrofurantoin macrocrystal], Quinolones, Amoxicillin, Etodolac, Mometasone, Premarin [conjugated estrogens], Vioxx [rofecoxib], Ketek [telithromycin], Robaxin [methocarbamol], Aspartame and phenylalanine, Augmentin [amoxicillin-pot clavulanate], Celebrex [celecoxib], Cephalexin, Ciprofloxacin, Clindamycin/lincomycin, Codeine sulfate, Demerol [meperidine], Prilosec [omeprazole], and Septra [sulfamethoxazole-trimethoprim]  Review of Systems   Review of Systems  Constitutional: Positive for activity change and appetite change. Negative for fatigue and fever.  HENT: Negative for congestion and rhinorrhea.   Respiratory: Positive for cough, chest tightness and shortness of breath.   Gastrointestinal: Positive for nausea and vomiting. Negative for abdominal pain.  Genitourinary: Negative for dysuria and hematuria.  Musculoskeletal: Negative for arthralgias and myalgias.  Skin: Negative for rash.  Neurological: Negative for dizziness, weakness and headaches.   all other systems are negative except as noted in the HPI and PMH.    Physical Exam Updated Vital Signs BP (!) 143/78   Pulse 87   Temp 97.9 F (36.6 C) (Oral)   Resp 18   SpO2 99%   Physical Exam Vitals and nursing note reviewed.  Constitutional:      General: She is not in acute distress.    Appearance: She is well-developed.     Comments: Speaking full sentences, no distress  HENT:     Head: Normocephalic and atraumatic.     Mouth/Throat:     Pharynx: No  oropharyngeal exudate.     Comments:  No tongue or lip swelling Eyes:     Conjunctiva/sclera: Conjunctivae normal.     Pupils: Pupils are equal, round, and reactive to light.  Neck:     Comments: No meningismus. Cardiovascular:     Rate and Rhythm: Normal rate and regular rhythm.     Heart sounds: Normal heart sounds. No murmur.  Pulmonary:     Effort: Pulmonary effort is normal. No respiratory distress.     Breath sounds: Normal breath sounds.  Chest:     Chest wall: No tenderness.  Abdominal:     Palpations: Abdomen is soft.     Tenderness: There is no abdominal tenderness. There is no guarding or rebound.  Musculoskeletal:        General: No tenderness. Normal range of motion.     Cervical  back: Normal range of motion and neck supple.  Skin:    General: Skin is warm.     Capillary Refill: Capillary refill takes less than 2 seconds.  Neurological:     General: No focal deficit present.     Mental Status: She is alert and oriented to person, place, and time. Mental status is at baseline.     Cranial Nerves: No cranial nerve deficit.     Motor: No abnormal muscle tone.     Coordination: Coordination normal.     Comments:  5/5 strength throughout. CN 2-12 intact.Equal grip strength.   Psychiatric:        Behavior: Behavior normal.     ED Results / Procedures / Treatments   Labs (all labs ordered are listed, but only abnormal results are displayed) Labs Reviewed  CBC WITH DIFFERENTIAL/PLATELET - Abnormal; Notable for the following components:      Result Value   Hemoglobin 15.5 (*)    All other components within normal limits  BASIC METABOLIC PANEL  TROPONIN I (HIGH SENSITIVITY)  TROPONIN I (HIGH SENSITIVITY)    EKG EKG Interpretation  Date/Time:  Monday November 28 2019 14:03:43 EST Ventricular Rate:  76 PR Interval:    QRS Duration: 102 QT Interval:  376 QTC Calculation: 423 R Axis:   23 Text Interpretation: Sinus or ectopic atrial rhythm Borderline short PR  interval Abnormal R-wave progression, early transition No significant change was found Confirmed by Glynn Octave [id number]) on 11/28/2019 2:14:10 PM   Radiology DG Chest Portable 1 View  Result Date: 11/28/2019 CLINICAL DATA:  Chest pain and nausea. EXAM: PORTABLE CHEST 1 VIEW COMPARISON:  01/19/2018 FINDINGS: The heart size and mediastinal contours are within normal limits. Both lungs are clear. The visualized skeletal structures are unremarkable. IMPRESSION: Normal exam. Electronically Signed   By: Francene Boyers M.D.   On: 11/28/2019 14:33    Procedures Procedures (including critical care time)  Medications Ordered in ED Medications - No data to display  ED Course  I have reviewed the triage vital signs and the nursing notes.  Pertinent labs & imaging results that were available during my care of the patient were reviewed by me and considered in my medical decision making (see chart for details).    MDM Rules/Calculators/A&P                       Patient here for possible allergic reaction while eating soup. She did have some chest tightness and shortness of breath and episode of vomiting. She now feels better. No tongue or lip swelling.  EKG sinus rhythm  Given age and risk factors, will monitor and obtain labs.  R/o anginal equivalent.   Initial troponin negative.  Patient left AMA.  Was not able to reevaluate patient before she left.  She still mentioned to nurse that she felt fine and wanted to go home.  She understood that her lab work was pending.   Patient left AMA. Was not able to reassess patient before she left ED.   Final Clinical Impression(s) / ED Diagnoses Final diagnoses:  None    Rx / DC Orders ED Discharge Orders    None       Cody Oliger, Jeannett Senior, MD 11/28/19 1739

## 2019-11-28 NOTE — ED Notes (Signed)
Pt left AMA. Pt aware provider is waiting for labs to reevaluate patient. Pt states she feels fine now and is leaving. Pt denies SOB, denies chest pain. Pt A&Ox4 and ambulatory.

## 2019-11-28 NOTE — ED Triage Notes (Signed)
EMS reports pt ate a bowl of soup with MSG then vomited, took Benadryl, redness to arms that has now cleared. Refused EMS then vomited again and called EMS to come to hospital, 50 mg Benadryl given by EMS.

## 2019-12-14 DIAGNOSIS — Z9889 Other specified postprocedural states: Secondary | ICD-10-CM | POA: Diagnosis not present

## 2019-12-14 DIAGNOSIS — M25662 Stiffness of left knee, not elsewhere classified: Secondary | ICD-10-CM | POA: Diagnosis not present

## 2019-12-26 DIAGNOSIS — M25662 Stiffness of left knee, not elsewhere classified: Secondary | ICD-10-CM | POA: Diagnosis not present

## 2019-12-26 DIAGNOSIS — M25562 Pain in left knee: Secondary | ICD-10-CM | POA: Diagnosis not present

## 2019-12-26 DIAGNOSIS — Z9889 Other specified postprocedural states: Secondary | ICD-10-CM | POA: Diagnosis not present

## 2019-12-26 DIAGNOSIS — R5383 Other fatigue: Secondary | ICD-10-CM | POA: Diagnosis not present

## 2019-12-26 DIAGNOSIS — E039 Hypothyroidism, unspecified: Secondary | ICD-10-CM | POA: Diagnosis not present

## 2020-01-12 DIAGNOSIS — M67911 Unspecified disorder of synovium and tendon, right shoulder: Secondary | ICD-10-CM | POA: Diagnosis not present

## 2020-01-12 DIAGNOSIS — M24811 Other specific joint derangements of right shoulder, not elsewhere classified: Secondary | ICD-10-CM | POA: Diagnosis not present

## 2020-01-25 DIAGNOSIS — M67911 Unspecified disorder of synovium and tendon, right shoulder: Secondary | ICD-10-CM | POA: Diagnosis not present

## 2020-02-09 DIAGNOSIS — M79605 Pain in left leg: Secondary | ICD-10-CM | POA: Diagnosis not present

## 2020-02-09 DIAGNOSIS — M7989 Other specified soft tissue disorders: Secondary | ICD-10-CM | POA: Diagnosis not present

## 2020-02-09 DIAGNOSIS — Z96652 Presence of left artificial knee joint: Secondary | ICD-10-CM | POA: Diagnosis not present

## 2020-02-09 DIAGNOSIS — M79609 Pain in unspecified limb: Secondary | ICD-10-CM | POA: Diagnosis not present

## 2020-02-09 DIAGNOSIS — R2242 Localized swelling, mass and lump, left lower limb: Secondary | ICD-10-CM | POA: Diagnosis not present

## 2020-02-20 DIAGNOSIS — M25512 Pain in left shoulder: Secondary | ICD-10-CM | POA: Diagnosis not present

## 2020-02-20 DIAGNOSIS — M25562 Pain in left knee: Secondary | ICD-10-CM | POA: Diagnosis not present

## 2020-03-12 DIAGNOSIS — M25512 Pain in left shoulder: Secondary | ICD-10-CM | POA: Diagnosis not present

## 2020-03-27 DIAGNOSIS — E039 Hypothyroidism, unspecified: Secondary | ICD-10-CM | POA: Diagnosis not present

## 2020-03-27 DIAGNOSIS — M1712 Unilateral primary osteoarthritis, left knee: Secondary | ICD-10-CM | POA: Diagnosis not present

## 2020-03-27 DIAGNOSIS — E7800 Pure hypercholesterolemia, unspecified: Secondary | ICD-10-CM | POA: Diagnosis not present

## 2020-04-03 DIAGNOSIS — M1712 Unilateral primary osteoarthritis, left knee: Secondary | ICD-10-CM | POA: Diagnosis not present

## 2020-04-12 DIAGNOSIS — M1712 Unilateral primary osteoarthritis, left knee: Secondary | ICD-10-CM | POA: Diagnosis not present

## 2020-04-12 DIAGNOSIS — G8918 Other acute postprocedural pain: Secondary | ICD-10-CM | POA: Diagnosis not present

## 2020-04-13 DIAGNOSIS — Z7982 Long term (current) use of aspirin: Secondary | ICD-10-CM | POA: Diagnosis not present

## 2020-04-13 DIAGNOSIS — Z96642 Presence of left artificial hip joint: Secondary | ICD-10-CM | POA: Diagnosis not present

## 2020-04-13 DIAGNOSIS — M47816 Spondylosis without myelopathy or radiculopathy, lumbar region: Secondary | ICD-10-CM | POA: Diagnosis not present

## 2020-04-13 DIAGNOSIS — Z471 Aftercare following joint replacement surgery: Secondary | ICD-10-CM | POA: Diagnosis not present

## 2020-04-13 DIAGNOSIS — Z96652 Presence of left artificial knee joint: Secondary | ICD-10-CM | POA: Diagnosis not present

## 2020-04-13 DIAGNOSIS — M7541 Impingement syndrome of right shoulder: Secondary | ICD-10-CM | POA: Diagnosis not present

## 2020-04-16 DIAGNOSIS — M47816 Spondylosis without myelopathy or radiculopathy, lumbar region: Secondary | ICD-10-CM | POA: Diagnosis not present

## 2020-04-16 DIAGNOSIS — Z7982 Long term (current) use of aspirin: Secondary | ICD-10-CM | POA: Diagnosis not present

## 2020-04-16 DIAGNOSIS — Z471 Aftercare following joint replacement surgery: Secondary | ICD-10-CM | POA: Diagnosis not present

## 2020-04-16 DIAGNOSIS — Z96642 Presence of left artificial hip joint: Secondary | ICD-10-CM | POA: Diagnosis not present

## 2020-04-16 DIAGNOSIS — Z96652 Presence of left artificial knee joint: Secondary | ICD-10-CM | POA: Diagnosis not present

## 2020-04-16 DIAGNOSIS — M7541 Impingement syndrome of right shoulder: Secondary | ICD-10-CM | POA: Diagnosis not present

## 2020-04-18 DIAGNOSIS — Z96652 Presence of left artificial knee joint: Secondary | ICD-10-CM | POA: Diagnosis not present

## 2020-04-18 DIAGNOSIS — M47816 Spondylosis without myelopathy or radiculopathy, lumbar region: Secondary | ICD-10-CM | POA: Diagnosis not present

## 2020-04-18 DIAGNOSIS — M7541 Impingement syndrome of right shoulder: Secondary | ICD-10-CM | POA: Diagnosis not present

## 2020-04-18 DIAGNOSIS — Z471 Aftercare following joint replacement surgery: Secondary | ICD-10-CM | POA: Diagnosis not present

## 2020-04-18 DIAGNOSIS — Z7982 Long term (current) use of aspirin: Secondary | ICD-10-CM | POA: Diagnosis not present

## 2020-04-18 DIAGNOSIS — Z96642 Presence of left artificial hip joint: Secondary | ICD-10-CM | POA: Diagnosis not present

## 2020-04-19 DIAGNOSIS — Z7982 Long term (current) use of aspirin: Secondary | ICD-10-CM | POA: Diagnosis not present

## 2020-04-19 DIAGNOSIS — M47816 Spondylosis without myelopathy or radiculopathy, lumbar region: Secondary | ICD-10-CM | POA: Diagnosis not present

## 2020-04-19 DIAGNOSIS — Z471 Aftercare following joint replacement surgery: Secondary | ICD-10-CM | POA: Diagnosis not present

## 2020-04-19 DIAGNOSIS — Z96652 Presence of left artificial knee joint: Secondary | ICD-10-CM | POA: Diagnosis not present

## 2020-04-19 DIAGNOSIS — Z96642 Presence of left artificial hip joint: Secondary | ICD-10-CM | POA: Diagnosis not present

## 2020-04-19 DIAGNOSIS — M7541 Impingement syndrome of right shoulder: Secondary | ICD-10-CM | POA: Diagnosis not present

## 2020-04-20 DIAGNOSIS — Z96642 Presence of left artificial hip joint: Secondary | ICD-10-CM | POA: Diagnosis not present

## 2020-04-20 DIAGNOSIS — Z471 Aftercare following joint replacement surgery: Secondary | ICD-10-CM | POA: Diagnosis not present

## 2020-04-20 DIAGNOSIS — Z7982 Long term (current) use of aspirin: Secondary | ICD-10-CM | POA: Diagnosis not present

## 2020-04-20 DIAGNOSIS — M47816 Spondylosis without myelopathy or radiculopathy, lumbar region: Secondary | ICD-10-CM | POA: Diagnosis not present

## 2020-04-20 DIAGNOSIS — M7541 Impingement syndrome of right shoulder: Secondary | ICD-10-CM | POA: Diagnosis not present

## 2020-04-20 DIAGNOSIS — Z96652 Presence of left artificial knee joint: Secondary | ICD-10-CM | POA: Diagnosis not present

## 2020-04-21 ENCOUNTER — Other Ambulatory Visit: Payer: Self-pay

## 2020-04-21 ENCOUNTER — Ambulatory Visit (HOSPITAL_COMMUNITY)
Admission: RE | Admit: 2020-04-21 | Discharge: 2020-04-21 | Disposition: A | Discharge status: Home / Self Care | Payer: Medicare HMO | Source: Ambulatory Visit | Attending: Orthopaedic Surgery | Admitting: Orthopaedic Surgery

## 2020-04-21 DIAGNOSIS — R2242 Localized swelling, mass and lump, left lower limb: Secondary | ICD-10-CM

## 2020-04-21 DIAGNOSIS — M25562 Pain in left knee: Secondary | ICD-10-CM | POA: Diagnosis not present

## 2020-04-21 DIAGNOSIS — Z96652 Presence of left artificial knee joint: Secondary | ICD-10-CM

## 2020-04-21 DIAGNOSIS — M79609 Pain in unspecified limb: Secondary | ICD-10-CM | POA: Diagnosis not present

## 2020-04-21 DIAGNOSIS — M79605 Pain in left leg: Secondary | ICD-10-CM | POA: Diagnosis not present

## 2020-04-21 DIAGNOSIS — M7989 Other specified soft tissue disorders: Secondary | ICD-10-CM | POA: Insufficient documentation

## 2020-04-21 DIAGNOSIS — M25462 Effusion, left knee: Secondary | ICD-10-CM | POA: Diagnosis not present

## 2020-04-21 NOTE — Progress Notes (Signed)
VASCULAR LAB PRELIMINARY  PRELIMINARY  PRELIMINARY  PRELIMINARY  Left lower extremity venous duplex completed.    Preliminary report:  See CV proc for preliminary results.  Called Dr. Susa Simmonds with results.  Tarah Buboltz, RVT 04/21/2020, 12:30 PM

## 2020-04-23 DIAGNOSIS — M25662 Stiffness of left knee, not elsewhere classified: Secondary | ICD-10-CM | POA: Diagnosis not present

## 2020-04-23 DIAGNOSIS — Z471 Aftercare following joint replacement surgery: Secondary | ICD-10-CM | POA: Diagnosis not present

## 2020-04-23 DIAGNOSIS — Z96652 Presence of left artificial knee joint: Secondary | ICD-10-CM | POA: Diagnosis not present

## 2020-04-25 DIAGNOSIS — Z96652 Presence of left artificial knee joint: Secondary | ICD-10-CM | POA: Diagnosis not present

## 2020-04-25 DIAGNOSIS — M25662 Stiffness of left knee, not elsewhere classified: Secondary | ICD-10-CM | POA: Diagnosis not present

## 2020-04-25 DIAGNOSIS — Z471 Aftercare following joint replacement surgery: Secondary | ICD-10-CM | POA: Diagnosis not present

## 2020-04-30 DIAGNOSIS — Z471 Aftercare following joint replacement surgery: Secondary | ICD-10-CM | POA: Diagnosis not present

## 2020-04-30 DIAGNOSIS — Z96652 Presence of left artificial knee joint: Secondary | ICD-10-CM | POA: Diagnosis not present

## 2020-04-30 DIAGNOSIS — M25662 Stiffness of left knee, not elsewhere classified: Secondary | ICD-10-CM | POA: Diagnosis not present

## 2020-05-03 DIAGNOSIS — M25662 Stiffness of left knee, not elsewhere classified: Secondary | ICD-10-CM | POA: Diagnosis not present

## 2020-05-03 DIAGNOSIS — Z471 Aftercare following joint replacement surgery: Secondary | ICD-10-CM | POA: Diagnosis not present

## 2020-05-03 DIAGNOSIS — Z96652 Presence of left artificial knee joint: Secondary | ICD-10-CM | POA: Diagnosis not present

## 2020-05-08 DIAGNOSIS — M25662 Stiffness of left knee, not elsewhere classified: Secondary | ICD-10-CM | POA: Diagnosis not present

## 2020-05-08 DIAGNOSIS — Z471 Aftercare following joint replacement surgery: Secondary | ICD-10-CM | POA: Diagnosis not present

## 2020-05-08 DIAGNOSIS — Z96652 Presence of left artificial knee joint: Secondary | ICD-10-CM | POA: Diagnosis not present

## 2020-05-10 DIAGNOSIS — Z471 Aftercare following joint replacement surgery: Secondary | ICD-10-CM | POA: Diagnosis not present

## 2020-05-10 DIAGNOSIS — Z96652 Presence of left artificial knee joint: Secondary | ICD-10-CM | POA: Diagnosis not present

## 2020-05-10 DIAGNOSIS — M25662 Stiffness of left knee, not elsewhere classified: Secondary | ICD-10-CM | POA: Diagnosis not present

## 2020-05-14 DIAGNOSIS — M25662 Stiffness of left knee, not elsewhere classified: Secondary | ICD-10-CM | POA: Diagnosis not present

## 2020-05-14 DIAGNOSIS — Z471 Aftercare following joint replacement surgery: Secondary | ICD-10-CM | POA: Diagnosis not present

## 2020-05-14 DIAGNOSIS — Z96652 Presence of left artificial knee joint: Secondary | ICD-10-CM | POA: Diagnosis not present

## 2020-05-16 DIAGNOSIS — Z96652 Presence of left artificial knee joint: Secondary | ICD-10-CM | POA: Diagnosis not present

## 2020-05-16 DIAGNOSIS — M25662 Stiffness of left knee, not elsewhere classified: Secondary | ICD-10-CM | POA: Diagnosis not present

## 2020-05-16 DIAGNOSIS — Z471 Aftercare following joint replacement surgery: Secondary | ICD-10-CM | POA: Diagnosis not present

## 2020-07-03 DIAGNOSIS — L219 Seborrheic dermatitis, unspecified: Secondary | ICD-10-CM | POA: Diagnosis not present

## 2020-07-03 DIAGNOSIS — M810 Age-related osteoporosis without current pathological fracture: Secondary | ICD-10-CM | POA: Diagnosis not present

## 2020-07-03 DIAGNOSIS — J309 Allergic rhinitis, unspecified: Secondary | ICD-10-CM | POA: Diagnosis not present

## 2020-07-03 DIAGNOSIS — Z0000 Encounter for general adult medical examination without abnormal findings: Secondary | ICD-10-CM | POA: Diagnosis not present

## 2020-07-03 DIAGNOSIS — E7800 Pure hypercholesterolemia, unspecified: Secondary | ICD-10-CM | POA: Diagnosis not present

## 2020-07-03 DIAGNOSIS — E039 Hypothyroidism, unspecified: Secondary | ICD-10-CM | POA: Diagnosis not present

## 2020-07-03 DIAGNOSIS — R1310 Dysphagia, unspecified: Secondary | ICD-10-CM | POA: Diagnosis not present

## 2020-07-06 DIAGNOSIS — Z96652 Presence of left artificial knee joint: Secondary | ICD-10-CM | POA: Diagnosis not present

## 2020-07-06 DIAGNOSIS — Z471 Aftercare following joint replacement surgery: Secondary | ICD-10-CM | POA: Diagnosis not present

## 2020-07-12 DIAGNOSIS — H52203 Unspecified astigmatism, bilateral: Secondary | ICD-10-CM | POA: Diagnosis not present

## 2020-07-12 DIAGNOSIS — Z961 Presence of intraocular lens: Secondary | ICD-10-CM | POA: Diagnosis not present

## 2020-08-10 DIAGNOSIS — M25551 Pain in right hip: Secondary | ICD-10-CM | POA: Diagnosis not present

## 2020-08-10 DIAGNOSIS — M1611 Unilateral primary osteoarthritis, right hip: Secondary | ICD-10-CM | POA: Diagnosis not present

## 2020-08-15 DIAGNOSIS — M25551 Pain in right hip: Secondary | ICD-10-CM | POA: Diagnosis not present

## 2020-08-15 DIAGNOSIS — M1611 Unilateral primary osteoarthritis, right hip: Secondary | ICD-10-CM | POA: Diagnosis not present

## 2020-09-03 ENCOUNTER — Other Ambulatory Visit: Payer: Self-pay | Admitting: Family Medicine

## 2020-09-03 DIAGNOSIS — Z1231 Encounter for screening mammogram for malignant neoplasm of breast: Secondary | ICD-10-CM

## 2020-10-05 DIAGNOSIS — Z471 Aftercare following joint replacement surgery: Secondary | ICD-10-CM | POA: Diagnosis not present

## 2020-10-05 DIAGNOSIS — Z96652 Presence of left artificial knee joint: Secondary | ICD-10-CM | POA: Diagnosis not present

## 2020-10-15 DIAGNOSIS — R300 Dysuria: Secondary | ICD-10-CM | POA: Diagnosis not present

## 2020-10-15 DIAGNOSIS — N3001 Acute cystitis with hematuria: Secondary | ICD-10-CM | POA: Diagnosis not present

## 2020-11-06 ENCOUNTER — Ambulatory Visit: Payer: Medicare HMO

## 2020-12-10 DIAGNOSIS — L400 Psoriasis vulgaris: Secondary | ICD-10-CM | POA: Diagnosis not present

## 2021-01-17 DIAGNOSIS — H6123 Impacted cerumen, bilateral: Secondary | ICD-10-CM | POA: Diagnosis not present

## 2021-01-21 DIAGNOSIS — H60502 Unspecified acute noninfective otitis externa, left ear: Secondary | ICD-10-CM | POA: Diagnosis not present

## 2021-01-21 DIAGNOSIS — H6692 Otitis media, unspecified, left ear: Secondary | ICD-10-CM | POA: Diagnosis not present

## 2021-01-31 DIAGNOSIS — H65195 Other acute nonsuppurative otitis media, recurrent, left ear: Secondary | ICD-10-CM | POA: Diagnosis not present

## 2021-04-17 DIAGNOSIS — Z471 Aftercare following joint replacement surgery: Secondary | ICD-10-CM | POA: Diagnosis not present

## 2021-04-17 DIAGNOSIS — Z96652 Presence of left artificial knee joint: Secondary | ICD-10-CM | POA: Diagnosis not present

## 2021-05-01 DIAGNOSIS — H6692 Otitis media, unspecified, left ear: Secondary | ICD-10-CM | POA: Diagnosis not present

## 2021-05-14 DIAGNOSIS — H6122 Impacted cerumen, left ear: Secondary | ICD-10-CM | POA: Diagnosis not present

## 2021-05-14 DIAGNOSIS — H60543 Acute eczematoid otitis externa, bilateral: Secondary | ICD-10-CM | POA: Diagnosis not present

## 2021-07-09 DIAGNOSIS — E7800 Pure hypercholesterolemia, unspecified: Secondary | ICD-10-CM | POA: Diagnosis not present

## 2021-07-09 DIAGNOSIS — M810 Age-related osteoporosis without current pathological fracture: Secondary | ICD-10-CM | POA: Diagnosis not present

## 2021-07-09 DIAGNOSIS — J309 Allergic rhinitis, unspecified: Secondary | ICD-10-CM | POA: Diagnosis not present

## 2021-07-09 DIAGNOSIS — R1310 Dysphagia, unspecified: Secondary | ICD-10-CM | POA: Diagnosis not present

## 2021-07-09 DIAGNOSIS — Z0000 Encounter for general adult medical examination without abnormal findings: Secondary | ICD-10-CM | POA: Diagnosis not present

## 2021-07-09 DIAGNOSIS — M8588 Other specified disorders of bone density and structure, other site: Secondary | ICD-10-CM | POA: Diagnosis not present

## 2021-07-09 DIAGNOSIS — L219 Seborrheic dermatitis, unspecified: Secondary | ICD-10-CM | POA: Diagnosis not present

## 2021-07-09 DIAGNOSIS — Z23 Encounter for immunization: Secondary | ICD-10-CM | POA: Diagnosis not present

## 2021-07-09 DIAGNOSIS — E039 Hypothyroidism, unspecified: Secondary | ICD-10-CM | POA: Diagnosis not present

## 2021-08-05 DIAGNOSIS — Z8669 Personal history of other diseases of the nervous system and sense organs: Secondary | ICD-10-CM | POA: Diagnosis not present

## 2021-08-05 DIAGNOSIS — H73892 Other specified disorders of tympanic membrane, left ear: Secondary | ICD-10-CM | POA: Diagnosis not present

## 2021-08-14 DIAGNOSIS — L570 Actinic keratosis: Secondary | ICD-10-CM | POA: Diagnosis not present

## 2022-02-19 ENCOUNTER — Ambulatory Visit: Payer: Medicare HMO | Admitting: Podiatry

## 2022-02-21 ENCOUNTER — Ambulatory Visit: Payer: Medicare HMO | Admitting: Podiatry

## 2022-02-21 DIAGNOSIS — L603 Nail dystrophy: Secondary | ICD-10-CM | POA: Diagnosis not present

## 2022-02-27 ENCOUNTER — Encounter: Payer: Self-pay | Admitting: Podiatry

## 2022-02-27 NOTE — Progress Notes (Signed)
?Subjective:  ?Patient ID: Cheryl Nash, female    DOB: 06-06-1941,  MRN: [id number] ? ?Chief Complaint  ?Patient presents with  ? Nail Problem  ? ? ?81 y.o. female presents with the above complaint.  Patient presents with right hallux nail dystrophy.  She states it is not too painful but is very dystrophic.  She would like to have removed she has not seen anyone else prior to seeing me.  She denies any other acute complaints.  It is slowly starting to come off.  As she does not want any catch onto something else and cannot recall.  She denies any other acute complaints ? ? ?Review of Systems: Negative except as noted in the HPI. Denies N/V/F/Ch. ? ?Past Medical History:  ?Diagnosis Date  ? Allergic rhinitis   ? Chronic constipation   ? Hepatitis   ? 2003   TOXIC HEPATITIS  ? Histoplasmosis 1972  ? Hyperlipidemia   ? Hypoglycemia   ? Hypothyroidism   ? MVP (mitral valve prolapse)   ? Osteoporosis   ? Pancreatitis 2001  ? Pneumonia [phone]  ? Psoriasis   ? ? ?Current Outpatient Medications:  ?  aspirin EC 325 MG tablet, Take 325-650 mg by mouth as needed for mild pain, moderate pain or fever. , Disp: , Rfl:  ?  Calcium Citrate (CITRACAL PO), Take 1 tablet by mouth daily., Disp: , Rfl:  ?  chlorpheniramine (CHLOR-TRIMETON) 4 MG tablet, Take 4 mg by mouth every 4 (four) hours as needed for allergies., Disp: , Rfl:  ?  cyanocobalamin 1000 MCG tablet, Take 1,000 mcg by mouth daily., Disp: , Rfl:  ?  EPINEPHrine (EPIPEN 2-PAK) 0.3 mg/0.3 mL IJ SOAJ injection, Inject 0.3 mg into the muscle once as needed (for allergic reaction). , Disp: , Rfl:  ?  estradiol (ESTRACE) 0.1 MG/GM vaginal cream, Place 1 Applicatorful vaginally 2 (two) times a week. , Disp: , Rfl:  ?  levothyroxine (SYNTHROID, LEVOTHROID) 112 MCG tablet, Take 112 mcg by mouth daily before breakfast., Disp: , Rfl:  ?  liothyronine (CYTOMEL) 5 MCG tablet, Take 5 mcg by mouth daily., Disp: , Rfl:  ?  Multiple Vitamin (MULTIVITAMIN) tablet, Take 1 tablet  by mouth daily. Centrum silver, Disp: , Rfl:  ?  nitroGLYCERIN (NITROSTAT) 0.4 MG SL tablet, Place 0.4 mg under the tongue every 5 (five) minutes as needed for chest pain., Disp: , Rfl:  ?  Omega-3 Fatty Acids (FISH OIL) 1200 MG CAPS, Take 1 capsule by mouth daily., Disp: , Rfl:  ?  pantoprazole (PROTONIX) 40 MG tablet, Take 1 tablet (40 mg total) by mouth daily., Disp: 90 tablet, Rfl: 3 ? ?Social History  ? ?Tobacco Use  ?Smoking Status Never  ?Smokeless Tobacco Never  ? ? ?Allergies  ?Allergen Reactions  ? Levaquin [Levofloxacin In D5w] Other (See Comments)  ?  Toxic hepatitis  ? Macrodantin [Nitrofurantoin Macrocrystal] Anaphylaxis  ? Quinolones Other (See Comments)  ?     HX TOXIC HEPATITIS  ? Amoxicillin Other (See Comments)  ?  Marland KitchenFatigue, agitation and depression ?Has patient had a PCN reaction causing immediate rash, facial/tongue/throat swelling, SOB or lightheadedness with hypotension: unknown ?Has patient had a PCN reaction causing severe rash involving mucus membranes or skin necrosis: Yes ?Has patient had a PCN reaction that required hospitalization: No ?Has patient had a PCN reaction occurring within the last 10 years: Unknown ?If all of the above answers are "NO", then may proceed with Cephalosporin use. ? ?  ? Etodolac  Itching and Rash  ?  whelps  ? Mometasone Other (See Comments)  ?  RED, ITCHY, SWOLLEN EYE LIDS  ? Premarin [Conjugated Estrogens] Other (See Comments)  ?  VAGINAL CREAM-  HEADACHE, NAUSEA, VAGINAL BURNING  ? Vioxx [Rofecoxib] Itching and Other (See Comments)  ?  ITCHING, SEVERE LARYNGITIS  ? Ketek [Telithromycin]   ?  UNSPECIFIED REACTION   ? Robaxin [Methocarbamol] Itching  ? Aspartame And Phenylalanine Rash  ? Augmentin [Amoxicillin-Pot Clavulanate] Itching and Other (See Comments)  ?  Insomnia Has patient had a PCN reaction causing immediate rash, facial/tongue/throat swelling, SOB or lightheadedness with hypotension: Unknown ?Has patient had a PCN reaction causing severe rash  involving mucus membranes or skin necrosis: Yes ?Has patient had a PCN reaction that required hospitalization: No ?Has patient had a PCN reaction occurring within the last 10 years:Unknown ?If all of the above answers are "NO", then may proceed with Cephalosporin use. ?  ? Celebrex [Celecoxib] Itching  ? Cephalexin Rash and Other (See Comments)  ?  dermatitis  ? Ciprofloxacin Rash and Other (See Comments)  ?  dermatitis  ? Clindamycin/Lincomycin Nausea And Vomiting  ? Codeine Sulfate Itching and Rash  ? Demerol [Meperidine] Other (See Comments)  ?  INVOLUNTARY TWITCHING ?  ? Prilosec [Omeprazole] Other (See Comments)  ?  Abdominal pain   ? Septra [Sulfamethoxazole-Trimethoprim] Other (See Comments)  ?  Headache, insomnia  ? ?Objective:  ?There were no vitals filed for this visit. ?There is no height or weight on file to calculate BMI. ?Constitutional Well developed. ?Well nourished.  ?Vascular Dorsalis pedis pulses palpable bilaterally. ?Posterior tibial pulses palpable bilaterally. ?Capillary refill normal to all digits.  ?No cyanosis or clubbing noted. ?Pedal hair growth normal.  ?Neurologic Normal speech. ?Oriented to person, place, and time. ?Epicritic sensation to light touch grossly present bilaterally.  ?Dermatologic Pain on palpation of the entire/total nail on 1st digit of the right ?No other open wounds. ?No skin lesions.  ?Orthopedic: Normal joint ROM without pain or crepitus bilaterally. ?No visible deformities. ?No bony tenderness.  ? ?Radiographs: None ?Assessment:  ? ?1. Nail dystrophy   ? ?Plan:  ?Patient was evaluated and treated and all questions answered. ? ?Nail contusion/dystrophy hallux, right ?-Patient elects to proceed with minor surgery to remove entire toenail today. Consent reviewed and signed by patient. ?-Entire/total nail excised. See procedure note. ?-Educated on post-procedure care including soaking. Written instructions provided and reviewed. ?-Patient to follow up in 2 weeks for  nail check. ? ?Procedure: Excision of entire/total nail  ?Location: Right 1st toe digit ?Anesthesia: Lidocaine 1% plain; 1.5 mL and Marcaine 0.5% plain; 1.5 mL, digital block. ?Skin Prep: Betadine. ?Dressing: Silvadene; telfa; dry, sterile, compression dressing. ?Technique: Following skin prep, the toe was exsanguinated and a tourniquet was secured at the base of the toe. The affected nail border was freed and excised. The tourniquet was then removed and sterile dressing applied. ?Disposition: Patient tolerated procedure well. Patient to return in 2 weeks for follow-up.  ? ?No follow-ups on file. ? ?

## 2022-05-05 DIAGNOSIS — J3489 Other specified disorders of nose and nasal sinuses: Secondary | ICD-10-CM | POA: Diagnosis not present

## 2022-05-23 DIAGNOSIS — M1711 Unilateral primary osteoarthritis, right knee: Secondary | ICD-10-CM | POA: Diagnosis not present

## 2022-05-23 DIAGNOSIS — M25561 Pain in right knee: Secondary | ICD-10-CM | POA: Diagnosis not present

## 2022-05-26 DIAGNOSIS — M5136 Other intervertebral disc degeneration, lumbar region: Secondary | ICD-10-CM | POA: Diagnosis not present

## 2022-05-26 DIAGNOSIS — M47896 Other spondylosis, lumbar region: Secondary | ICD-10-CM | POA: Diagnosis not present

## 2022-05-26 DIAGNOSIS — M25552 Pain in left hip: Secondary | ICD-10-CM | POA: Diagnosis not present

## 2022-05-26 DIAGNOSIS — Z96642 Presence of left artificial hip joint: Secondary | ICD-10-CM | POA: Diagnosis not present

## 2022-05-26 DIAGNOSIS — M5442 Lumbago with sciatica, left side: Secondary | ICD-10-CM | POA: Diagnosis not present

## 2022-07-22 DIAGNOSIS — M2559 Pain in other specified joint: Secondary | ICD-10-CM | POA: Diagnosis not present

## 2022-07-22 DIAGNOSIS — J309 Allergic rhinitis, unspecified: Secondary | ICD-10-CM | POA: Diagnosis not present

## 2022-07-22 DIAGNOSIS — E039 Hypothyroidism, unspecified: Secondary | ICD-10-CM | POA: Diagnosis not present

## 2022-07-22 DIAGNOSIS — Z0000 Encounter for general adult medical examination without abnormal findings: Secondary | ICD-10-CM | POA: Diagnosis not present

## 2022-07-22 DIAGNOSIS — E559 Vitamin D deficiency, unspecified: Secondary | ICD-10-CM | POA: Diagnosis not present

## 2022-07-22 DIAGNOSIS — N952 Postmenopausal atrophic vaginitis: Secondary | ICD-10-CM | POA: Diagnosis not present

## 2022-07-22 DIAGNOSIS — E7800 Pure hypercholesterolemia, unspecified: Secondary | ICD-10-CM | POA: Diagnosis not present

## 2022-07-22 DIAGNOSIS — Z23 Encounter for immunization: Secondary | ICD-10-CM | POA: Diagnosis not present

## 2022-09-17 DIAGNOSIS — M25552 Pain in left hip: Secondary | ICD-10-CM | POA: Diagnosis not present

## 2022-09-30 DIAGNOSIS — M533 Sacrococcygeal disorders, not elsewhere classified: Secondary | ICD-10-CM | POA: Diagnosis not present

## 2022-10-22 DIAGNOSIS — M533 Sacrococcygeal disorders, not elsewhere classified: Secondary | ICD-10-CM | POA: Diagnosis not present

## 2022-12-02 DIAGNOSIS — E039 Hypothyroidism, unspecified: Secondary | ICD-10-CM | POA: Diagnosis not present

## 2022-12-02 DIAGNOSIS — R413 Other amnesia: Secondary | ICD-10-CM | POA: Diagnosis not present

## 2022-12-05 ENCOUNTER — Other Ambulatory Visit: Payer: Self-pay | Admitting: Family Medicine

## 2022-12-05 DIAGNOSIS — R413 Other amnesia: Secondary | ICD-10-CM

## 2022-12-12 DIAGNOSIS — M25561 Pain in right knee: Secondary | ICD-10-CM | POA: Diagnosis not present

## 2022-12-26 ENCOUNTER — Ambulatory Visit
Admission: RE | Admit: 2022-12-26 | Discharge: 2022-12-26 | Disposition: A | Discharge status: Home / Self Care | Payer: Medicare HMO | Source: Ambulatory Visit | Attending: Family Medicine | Admitting: Family Medicine

## 2022-12-26 DIAGNOSIS — J3489 Other specified disorders of nose and nasal sinuses: Secondary | ICD-10-CM | POA: Diagnosis not present

## 2022-12-26 DIAGNOSIS — I6782 Cerebral ischemia: Secondary | ICD-10-CM | POA: Diagnosis not present

## 2022-12-26 DIAGNOSIS — G319 Degenerative disease of nervous system, unspecified: Secondary | ICD-10-CM | POA: Diagnosis not present

## 2022-12-26 DIAGNOSIS — R413 Other amnesia: Secondary | ICD-10-CM

## 2023-01-26 ENCOUNTER — Ambulatory Visit: Payer: Medicare HMO | Admitting: Neurology

## 2023-02-11 ENCOUNTER — Other Ambulatory Visit: Payer: Self-pay | Admitting: Neurology

## 2023-02-11 ENCOUNTER — Ambulatory Visit: Payer: Medicare HMO | Admitting: Neurology

## 2023-02-12 ENCOUNTER — Encounter: Payer: Self-pay | Admitting: Neurology

## 2023-02-12 ENCOUNTER — Ambulatory Visit: Payer: Medicare HMO | Admitting: Neurology

## 2023-02-12 VITALS — BP 122/61 | HR 61 | Ht 67.0 in | Wt 180.0 lb

## 2023-02-12 DIAGNOSIS — R413 Other amnesia: Secondary | ICD-10-CM | POA: Diagnosis not present

## 2023-02-12 DIAGNOSIS — R4189 Other symptoms and signs involving cognitive functions and awareness: Secondary | ICD-10-CM

## 2023-02-12 NOTE — Progress Notes (Addendum)
[facility]    Provider:  Dr Lucia GaskinsAhern Requesting Provider: Johny BlamerHarris, William, MD Primary Care Provider:  Johny BlamerHarris, William, MD  CC:  shortterm memory loss  HPI:  Cheryl Nash is a 82 y.o. female here as requested by Johny BlamerHarris, William, MD for short-term memory loss. Long term memory goo. Started in the last year. She would ask the same questions over and over. Sheis unable to hold on short-term thoughts. She doesn't drive for 7-8 years but not for any problems, not getting lost. No episodesof confusion. She cooks, does the things she always done. She gets confused on the day of the week. They do yardwork and mowing. Physically active, they walk 3 miles a day. No depression or anxiety. A little less social due to virus but they still see family and neighbors and still interested in hobbies. They work puzzles, play games. Forgets recent conversations and events. Doesn't struggle finding the right word for an object. Sleeping well. Good at names and annniversaries. She loses and misplaces things and puts them in places that may not be normal. Takes own medication, doesn't miss it. Husband has always paid the bills. Sometimes forgets steps to turn on TV. Father had parkinson's disease no dementia. No dementia in the family. Her husband and kids have noticed esp the repeating of things and asking the same questions in the same day. Husband provides much information. No other focal neurologic deficits, associated symptoms, inciting events or modifiable factors.  Reviewed notes, labs and imaging from outside physicians, which showed:  B12, tsh, cbc/cmp nml  12/03/2022: rpr neg, b12 > 1500, t3 total nml,ft4nml, tsh nml, BUN 12, creat 0.74  MRI brain 12/29/2022:  FINDINGS: Brain: There is no evidence of an acute infarct, intracranial hemorrhage, mass, midline shift, or extra-axial fluid collection. T2 hyperintensities in the cerebral white matter of progressed and are nonspecific but  compatible with mild chronic small vessel ischemic disease. There is progressive, moderate global cerebral atrophy as well as severe bilateral anterior temporal lobe atrophy.   Vascular: Major intracranial vascular flow voids are preserved.   Skull and upper cervical spine: Unremarkable bone marrow signal.   Sinuses/Orbits: Bilateral cataract extraction. Mild mucosal thickening in the paranasal sinuses. Clear mastoid air cells.   Other: None.   IMPRESSION: 1. No acute intracranial abnormality. 2. Progressive cerebral atrophy including severe bilateral temporal lobe atrophy. 3. Mild chronic small vessel ischemic disease.     Electronically Signed   By: Sebastian AcheAllen  Grady M.D.   On: 12/29/2022 18:17     Review of Systems: Patient complains of symptoms per HPI as well as the following symptoms short term memory loss. Pertinent negatives and positives per HPI. All others negative.   Social History   Socioeconomic History   Marital status: Married    Spouse name: Ron   Number of children: 4   Years of education: Not on file   Highest education level: Not on file  Occupational History   Occupation: retired [employer]  Tobacco Use   Smoking status: Never   Smokeless tobacco: Never  Vaping Use   Vaping Use: Never used  Substance and Sexual Activity   Alcohol use: No    Alcohol/week: 0.0 standard drinks of alcohol   Drug use: No   Sexual activity: Not on file  Other Topics Concern   Not on file  Social History Narrative   Not on file   Social Determinants of Health   Financial Resource Strain: Not on file  Food Insecurity:  Not on file  Transportation Needs: Not on file  Physical Activity: Not on file  Stress: Not on file  Social Connections: Not on file  Intimate Partner Violence: Not on file    Family History  Problem Relation Age of Onset   Heart attack Mother    CAD Mother    Aneurysm Maternal Grandmother    Colon cancer Maternal Grandfather    Dementia Neg Hx     Alzheimer's disease Neg Hx     Past Medical History:  Diagnosis Date   Allergic rhinitis    Chronic constipation    Hepatitis    2003   TOXIC HEPATITIS   Histoplasmosis 1972   Hyperlipidemia    Hypoglycemia    Hypothyroidism    MVP (mitral valve prolapse)    Osteoporosis    Pancreatitis 2001   Pneumonia [phone]   Psoriasis     Patient Active Problem List   Diagnosis Date Noted   Short-term memory loss 02/12/2023   Primary osteoarthritis of left hip 12/23/2016    Past Surgical History:  Procedure Laterality Date   ABDOMINAL HYSTERECTOMY     APPENDECTOMY     CATARACT EXTRACTION Bilateral 11/2015,12/2015   CHOLECYSTECTOMY     ESOPHAGEAL DILATION     KNEE ARTHROSCOPY     LIVER RESECTION     LUMBAR DISC SURGERY  05/2000   L4-5   LYSIS OF ADHESION  2001   pancreatic duct  1985   enlargement of duct   TONSILLECTOMY AND ADENOIDECTOMY  1948   TOTAL ABDOMINAL HYSTERECTOMY W/ BILATERAL SALPINGOOPHORECTOMY  1970   TOTAL HIP ARTHROPLASTY Left 12/23/2016   Procedure: TOTAL HIP ARTHROPLASTY ANTERIOR APPROACH;  Surgeon: Marcene Corning, MD;  Location: [facility] OR;  Service: Orthopedics;  Laterality: Left;    Current Outpatient Medications  Medication Sig Dispense Refill   aspirin EC 325 MG tablet Take 325-650 mg by mouth as needed for mild pain, moderate pain or fever.      Calcium Citrate (CITRACAL PO) Take 1 tablet by mouth daily.     chlorpheniramine (CHLOR-TRIMETON) 4 MG tablet Take 4 mg by mouth every 4 (four) hours as needed for allergies.     cyanocobalamin 1000 MCG tablet Take 1,000 mcg by mouth daily.     EPINEPHrine (EPIPEN 2-PAK) 0.3 mg/0.3 mL IJ SOAJ injection Inject 0.3 mg into the muscle once as needed (for allergic reaction).      estradiol (ESTRACE) 0.1 MG/GM vaginal cream Place 1 Applicatorful vaginally 2 (two) times a week.      levothyroxine (SYNTHROID, LEVOTHROID) 112 MCG tablet Take 112 mcg by mouth daily before breakfast.     liothyronine (CYTOMEL) 5 MCG  tablet Take 5 mcg by mouth daily.     Multiple Vitamin (MULTIVITAMIN) tablet Take 1 tablet by mouth daily. Centrum silver     nitroGLYCERIN (NITROSTAT) 0.4 MG SL tablet Place 0.4 mg under the tongue every 5 (five) minutes as needed for chest pain.     Omega-3 Fatty Acids (FISH OIL) 1200 MG CAPS Take 1 capsule by mouth daily.     pantoprazole (PROTONIX) 40 MG tablet Take 1 tablet (40 mg total) by mouth daily. 90 tablet 3   No current facility-administered medications for this visit.    Allergies as of 02/12/2023 - Review Complete 02/12/2023  Allergen Reaction Noted   Levaquin [levofloxacin in d5w] Other (See Comments) 04/24/2016   Macrodantin [nitrofurantoin macrocrystal] Anaphylaxis 04/24/2016   Quinolones Other (See Comments) 12/16/2016   Amoxicillin Other (See Comments) 04/24/2016  Etodolac Itching and Rash 04/24/2016   Mometasone Other (See Comments) 06/08/2013   Premarin [conjugated estrogens] Other (See Comments) 12/16/2016   Vioxx [rofecoxib] Itching and Other (See Comments) 06/08/2013   Ketek [telithromycin]  04/24/2016   Robaxin [methocarbamol] Itching 01/09/2017   Aspartame and phenylalanine Rash 04/24/2016   Augmentin [amoxicillin-pot clavulanate] Itching and Other (See Comments) 04/24/2016   Celebrex [celecoxib] Itching 04/24/2016   Cephalexin Rash and Other (See Comments) 04/24/2016   Ciprofloxacin Rash and Other (See Comments) 04/24/2016   Clindamycin/lincomycin Nausea And Vomiting 12/11/2016   Codeine sulfate Itching and Rash 04/24/2016   Demerol [meperidine] Other (See Comments) 06/08/2013   Prilosec [omeprazole] Other (See Comments) 12/11/2016   Septra [sulfamethoxazole-trimethoprim] Other (See Comments) 04/24/2016    Vitals: BP 122/61   Pulse 61   Ht 5\' 7"  (1.702 m)   Wt 180 lb (81.6 kg)   BMI 28.19 kg/m  Last Weight:  Wt Readings from Last 1 Encounters:  02/12/23 180 lb (81.6 kg)   Last Height:   Ht Readings from Last 1 Encounters:  02/12/23 5\' 7"   (1.702 m)     Physical exam: Exam: Gen: NAD, conversant, well nourised, well groomed                     CV: RRR, no MRG. No Carotid Bruits. No peripheral edema, warm, nontender Eyes: Conjunctivae clear without exudates or hemorrhage  Neuro: Detailed Neurologic Exam  Speech:    Speech is normal; fluent and spontaneous with normal comprehension.  Cognition:    02/12/2023    3:18 PM  MMSE - Mini Mental State Exam  Orientation to time 3  Orientation to Place 4  Registration 3  Attention/ Calculation 4  Recall 2  Language- name 2 objects 2  Language- repeat 1  Language- follow 3 step command 3  Language- read & follow direction 1  Write a sentence 1  Copy design 1  Total score 25      02/12/2023    3:24 PM  Montreal Cognitive Assessment   Visuospatial/ Executive (0/5) 4  Naming (0/3) 3  Attention: Read list of digits (0/2) 1  Attention: Read list of letters (0/1) 1  Attention: Serial 7 subtraction starting at 100 (0/3) 3  Language: Repeat phrase (0/2) 2  Language : Fluency (0/1) 1  Abstraction (0/2) 2  Delayed Recall (0/5) 0  Orientation (0/6) 4  Total 21   Cranial Nerves:    The pupils are equal, round, and reactive to light. Attempted, pupils too small to visualize. Visual fields are full to finger confrontation. Extraocular movements are intact. Trigeminal sensation is intact and the muscles of mastication are normal. The face is symmetric. The palate elevates in the midline. Hearing intact. Voice is normal. Shoulder shrug is normal. The tongue has normal motion without fasciculations.   Coordination:    Normal finger to nose and heel to shin.   Gait: Not parkinsonian  Motor Observation:    No asymmetry, no atrophy, and no involuntary movements noted. Tone:    Normal muscle tone.    Posture:    Posture is normal. normal erect    Strength:    Strength is V/V in the upper and lower limbs.      Sensation: intact to LT     Reflex Exam:  DTR's:     Deep tendon reflexes in the upper and lower extremities are symmetrical bilaterally.   Toes:    The toes are downgoing bilaterally.   Clonus:  Clonus is absent.    Assessment/Plan:  82 year old patient with short term memory loss and Progressive cerebral atrophy including severe bilateral temporal lobe atrophy concern for progression to alzheimer's disease. MMSE 25/30. MoCA 21/30  ATN profile - looks for alzheimer's markers in the blood APOE4- any Alzheimers genes Formal neurocognitive testing: Dr. Clayborn Heron in [place] IF we see anything abnormal: PET Scan to look for amyloid in the brain Would start you on Aricept/donepezil, Memantine/Namenda Discussed [email or web address]  Orders Placed This Encounter  Procedures   ATN PROFILE   APOE Alzheimer's Risk   Ambulatory referral to Neuropsychology     Cc: Johny Blamer, MD,  Johny Blamer, MD  Naomie Dean, MD  [facility] [address] [place], [place] [address]  Phone [phone] Fax [phone]  I spent over 60 minutes of face-to-face and non-face-to-face time with patient on the  1. Short-term memory loss   2. Memory loss   3. Cognitive decline    diagnosis.  This included previsit chart review, lab review, study review, order entry, electronic health record documentation, patient education on the different diagnostic and therapeutic options, counseling and coordination of care, risks and benefits of management, compliance, or risk factor reduction

## 2023-02-12 NOTE — Patient Instructions (Addendum)
ATN profile - looks for alzheimer's markers in the blood APOE4- any Alzheimers genes Formal neurocognitive testing: Dr. Clayborn Heron in [place] IF we see anything abnormal: PET Scan to look for amyloid in the brain Would start you on Aricept/donepezil, Memantine/Namenda  Lecanemab Injection What is this medication? LECANEMAB (lek AN e mab) treats Alzheimer disease. It works by decreasing the buildup of amyloid, a protein that may cause Alzheimer disease. This may slow down the worsening of symptoms. It is a monoclonal antibody. This medicine may be used for other purposes; ask your health care provider or pharmacist if you have questions. COMMON BRAND NAME(S): LEQEMBI What should I tell my care team before I take this medication? They need to know if you have any of these conditions: An unusual or allergic reaction to lecanemab, other medications, foods, dyes, or preservatives Take medications that treat or prevent blood clots Pregnant or trying to get pregnant Breast-feeding How should I use this medication? This medication is injected into a vein. It is given by your care team in a hospital or clinic setting. A special MedGuide will be given to you before each treatment. Be sure to read this information carefully each time. Talk to your care team about the use of this medication in children. Special care may be needed. Overdosage: If you think you have taken too much of this medicine contact a poison control center or emergency room at once. NOTE: This medicine is only for you. Do not share this medicine with others. What if I miss a dose? Keep appointments for follow-up doses. It is important not to miss your dose. Call your care team if you are unable to keep an appointment. What may interact with this medication? Interactions have not been studied. This list may not describe all possible interactions. Give your health care provider a list of all the medicines, herbs,  non-prescription drugs, or dietary supplements you use. Also tell them if you smoke, drink alcohol, or use illegal drugs. Some items may interact with your medicine. What should I watch for while using this medication? Visit your care team for regular checks on your progress. Tell your care team if your symptoms do not start to get better or if they get worse. What side effects may I notice from receiving this medication? Side effects that you should report to your care team as soon as possible: Allergic reactions or angioedema--skin rash, itching or hives, swelling of the face, eyes, lips, tongue, arms, or legs, trouble swallowing or breathing Headache, worsening confusion, dizziness, change in vision, nausea, seizures Infusion reactions--chest pain, shortness of breath or trouble breathing, feeling faint or lightheaded Side effects that usually do not require medical attention (report these to your care team if they continue or are bothersome): Cough Diarrhea Headache This list may not describe all possible side effects. Call your doctor for medical advice about side effects. You may report side effects to FDA at 1-800-FDA-1088. Where should I keep my medication? This medication is given in a hospital or clinic. It will not be stored at home. NOTE: This sheet is a summary. It may not cover all possible information. If you have questions about this medicine, talk to your doctor, pharmacist, or health care provider.  2023 Elsevier/Gold Standard (2021-11-19 00:00:00)  Alzheimer's Disease Alzheimer's disease is a brain disease that affects memory, thinking, language, and behavior. People with Alzheimer's disease lose mental abilities, and the disease gets worse over time. Alzheimer's disease is a form of dementia. What  are the causes? This condition develops when a protein called beta-amyloid forms deposits in the brain. It is not known what causes these deposits to form. Alzheimer's disease may  also be caused by a gene mutation that is inherited from one parent or both parents. A gene mutation is a harmful change in a gene. Not everyone who inherits the genetic mutation will get the disease. What increases the risk? You are more likely to develop this condition if you: Are older than age 10. Are female. Have any of these medical conditions: High blood pressure. Diabetes. Heart or blood vessel disease. Smoke. Have obesity. Have had a brain injury. Have had a stroke. Have a family history of dementia. What are the signs or symptoms? Symptoms of this condition may happen in three stages, which often overlap. Early stage In this stage, you may continue to be independent. You may still be able to drive, work, and be social. Symptoms in this stage include: Minor memory problems, such as forgetting a name, words, or what you did recently. Difficulty with: Paying attention. Communicating. Doing familiar tasks. Problem solving or doing calculations. Following instructions. Learning new things. Anxiety. Social withdrawal. Loss of motivation. Moderate stage In this stage, you will start to need care. Symptoms in this stage include: Difficulty with expressing thoughts. Memory loss that affects daily life. This can include forgetting: Recent events that have happened. If you have taken medicines or eaten. Familiar places. You may get lost while walking or driving. To pay bills or manage finances. Personal hygiene such as bathing or using the bathroom. Confusion about where you are or what time it is. Difficulty in judging distance. Changes in personality, mood, and behavior. You may be moody, irritable, angry, frustrated, fearful, anxious, or suspicious. Poor reasoning and judgment. Delusions or hallucinations. Changes in sleep patterns. Severe stage In the final stage, you will need help with your personal care and daily activities. Symptoms in this stage  include: Worsening memory loss. Personality changes. Loss of awareness of your surroundings. Changes in physical abilities, including the ability to walk, sit, and swallow. Difficulty in communicating. Inability to control your bladder and bowels. Increasing confusion. Increasing behavior changes. How is this diagnosed?  This condition is diagnosed by a health care provider who specializes in diseases of the nervous system (neurologist) or one who specializes in care of the elderly (geriatrician or geriatric psychiatrist). Other causes of dementia may also be ruled out. Your health care provider will talk with you and your family, friends, or caregivers about your history and symptoms. A thorough medical history will be taken, and you will have a physical exam and tests. Tests may include: Lab tests, such as blood or urine tests. Imaging tests, such as a CT scan, a PET scan, or an MRI. A lumbar puncture. This test involves removing and testing a small amount of the fluid that surrounds the brain and spinal cord. An electroencephalogram (EEG). In this test, small metal discs are used to measure electrical activity in the brain. Memory tests, cognitive tests, and neuropsychological tests. These tests evaluate brain function. Genetic testing. This may be done if you have early onset of the disease (before age 34) or if other family members have the disease. How is this treated? At this time, there is no treatment to cure Alzheimer's disease or stop it from getting worse. The goals of treatment are: To manage behavioral changes. To provide you with a safe environment. To help manage daily life for you and  your caregivers. The following treatment options are available: Medicines. Medicines may help the memory work better and manage behavioral symptoms. Cognitive therapy. Cognitive therapy provides you with education, support, and memory aids. It is most helpful in the early stages of the  condition. Counseling or spiritual guidance. It is normal to have a lot of feelings, including anger, relief, fear, and isolation. Counseling and guidance can help you deal with these feelings. Caregiving. This involves having caregivers help you with your daily activities. Family support groups. These provide education, emotional support, and information about community resources to family members who are taking care of you. Follow these instructions at home:  Medicines Take over-the-counter and prescription medicines only as told by your health care provider. Use a pill organizer or pill reminder to help you manage your medicines. Avoid taking medicines that can affect thinking, such as pain medicines or sleeping medicines. Lifestyle Make healthy lifestyle choices: Be physically active as told by your health care provider. Regular exercise may help improve symptoms. Do not use any products that contain nicotine or tobacco, such as cigarettes, e-cigarettes, and chewing tobacco. If you need help quitting, ask your health care provider. Do not drink alcohol. Eat a healthy diet. Practice stress-management techniques when you get stressed. Stay social. Drink enough fluid to keep your urine pale yellow. Make sure to get quality sleep. Avoid taking long naps during the day. Take short naps of 30 minutes or less if needed. Keep your sleeping area dark and cool. Avoid exercising during the few hours before you go to bed. Avoid caffeine products in the afternoon and evening. General instructions Work with your health care provider to determine what you need help with and what your safety needs are. If you were given a bracelet that identifies you as a person with memory loss or tracks your location, make sure to wear it at all times. Talk with your health care provider about whether it is safe for you to drive. Work with your family to make important decisions, such as advance directives, medical  power of attorney, or a living will. Keep all follow-up visits. This is important. Where to find more information The Alzheimer's Association: Call the 24-hour helpline at 954-045-9577, or visit LimitLaws.hu Contact a health care provider if: You have nausea, vomiting, or trouble with eating related to a medicine. You have worsening mood or behavior changes, such as depression, anxiety, or hallucinations. You or your family members become concerned for your safety. Get help right away if: You become less responsive or are difficult to wake up. Your memory suddenly gets worse. You feel that you want to harm yourself. If you ever feel like you may hurt yourself or others, or have thoughts about taking your own life, get help right away. Go to your nearest emergency department or: Call your local emergency services (911 in the [place] Call a suicide crisis helpline, such as the National Suicide Prevention Lifeline at 614-028-3510 or 988 in the [place] This is open 24 hours a day in the [place]. Text the Crisis Text Line at (276)821-2457 (in the U.S.). Summary Alzheimer's disease is a brain disease that affects memory, thinking, language, and behavior. Alzheimer's disease is a form of dementia. This condition is diagnosed by a specialist in diseases of the nervous system (neurologist) or one who specializes in care of the elderly. At this time, there is no treatment to cure Alzheimer's disease or stop it from getting worse. The goal of treatment is to help you  manage any symptoms. Work with your family to make important decisions, such as advance directives, medical power of attorney, or a living will. This information is not intended to replace advice given to you by your health care provider. Make sure you discuss any questions you have with your health care provider. Document Revised: 05/15/2021 Document Reviewed: 02/06/2020 Elsevier Patient Education  2023 Elsevier Inc.  Recommendations to prevent or  slow progression of cognitive decline:   Exercise You should increase exercise 30 to 45 minutes per day at least 3 days a week although 5 to 7 would be preferred. Any type of exercise (including walking) is acceptable although a recumbent bicycle may be best if you are unsteady. Disease related apathy can be a significant roadblock to exercise and the only way to overcome this is to make it a daily routine and perhaps have a reward at the end (something your loved one loves to eat or drink perhaps) or a personal trainer coming to the home can also be very useful. In general a structured, repetitive schedule is best.   Cardiovascular Health: You should optimize all cardiovascular risk factors (blood pressure, sugar, cholesterol) as vascular disease such as strokes and heart attacks can make memory problems much worse.   Diet: Eating a heart healthy (Mediterranean) diet is also a good idea; fish and poultry instead of red meat, nuts (mostly non-peanuts), vegetables, fruits, olive oil or canola oil (instead of butter), minimal salt (use other spices to flavor foods), whole grain rice, bread, cereal and pasta and wine in moderation.  General Health: Any diseases which effect your body will effect your brain such as a pneumonia, urinary infection, blood clot, heart attack or stroke. Keep contact with your primary care doctor for regular follow ups.  Sleep. A good nights sleep is healthy for the brain. Seven hours is recommended. If you have insomnia or poor sleep habits see the recommendations below  Tips: Structured and consistent daytime and nighttime routine, including regular wake times, bedtimes, and mealtimes, will be important for the patient to avoid confusion. Keeping frequently used items in designated places will help reduce stress from searching. If there are worries about getting lost do not let the patient leave home unaccompanied. They might benefit from wearing an identification bracelet that  will help others assist in finding home if they become lost. Information about nationwide safe return services and other helpful resources may be obtained through the Alzheimer's Association helpline at 1800-512-624-9621.  Finances, Power of Restaurant manager, fast food Directives: You should consider putting legal safeguards in place with regard to financial and medical decision making. While the spouse always has power of attorney for medical and financial issues in the absence of any form, you should consider what you want in case the spouse / caregiver is no longer around or capable of making decisions.   [place] : MovieEvening.com.au.pdf  Or Google "[facility]" AND "An Proofreader for [employer]  Other States: PainBasics.com.au  The signature on these forms should be notarized.   DRIVING:   Driving only during the day Drive only to familiar Locations Avoid driving during bad weather  If you would like to be tested to see if you are driving safely, [facility] has a Clinical Driving Evaluation. To schedule an appointment call [phone].                RESOURCES:  Memory Loss: Improve your short term memory By Laverle Patter  The Alzheimer's Reading Room http://www.alzheimersreadingroom.com/  The Alzheimer's Compendium http://www.alzcompend.info/  [employer] Family Support Program www.dukefamilysupport.MVHorg [phone]  Recommended resources for caregivers (All can be purchased on [employer] Corporationmazon):  1) A Caregiver's Guide to Dementia: Using Activities and Other Strategies to Prevent, Reduce and Manage Behavioral Symptoms by Mahala MenghiniLaura N. Bethena MidgetGitlin and General MillsCatherine Verrier Piersol   2) A Caregiver's Guide to Owens & MinorLewy Body Dementia by Briant SitesHelen Buell Whitworth MS BSN and Velia MeyerJames Whitworth   3) What If It's Not Alzheimer's?: A Caregiver's Guide to Dementia by Neila GearGary Radin  (Author), Roberts GaudyLisa Radin (Editor)  3) The 36 hour day by Rabins and Mace  4) Understanding Difficult Behaviors by Gerre Scullobinson, Spencer and White  Online course for helping caregivers reduce stress, guilt and frustration called the Caregivers Helpbook. The website is www.powerfultoolsforcaregivers.org  As a caregiver you are a Government social research officerproblem-solver. Problems you face as a caregiver are usually unique to your situation and the way your loved-one's disease manifests itself. The best way to use these books is to look at the Table of Contents and read any chapters of interest or that apply to challenges you are having as a caregiver.  NATIONAL RESOURCES: For more information on neurological disorders or research programs funded by the [employer] Millsational Institute of Neurological Disorders and Stroke, contact the Institute's GafferBrain Resources and Information Network (BRAIN) at: BRAIN [address] [place], [place] [id number] [phone] (toll-free) ToledoAutomobile.co.ukwww.ninds.nih.gov  Information on dementia is also available from the following organizations: Alzheimer's Disease Education and Referral (ADEAR) Center [employer] [facility] on Aging [address] [place] [address] [phone] (toll-free) CashCowGambling.bewww.nia.nih.gov/alzheimers  Alzheimer's Association [address] [place], [place] [address] [phone] (toll-free, 24-hour helpline) [phone] (TDD) LimitLaws.huwww.alz.org  Alzheimer's Foundation of [place] [address], 7th Floor [place], [place] [id number] [phone] (toll-free) www.alzfdn.org  Alzheimer's Drug Discovery Foundation [address], [address] [place], [place] [id number] [phone] www.alzdiscovery.org  Association for Frontotemporal Degeneration [address] [place], [place] [phone] (toll-free) www.theaftd.[facility]org  Bright[facility] [address] [place], [place] [phone]  (toll-free) www.brightfocus.org/alzheimers  Earl GalaJohn Douglas French Alzheimer's Foundation [address], [address] [place], [place] [id number] [phone] www.BushWebsites.nljdfaf.org  Lewy Body Dementia Association [address], [place], [place] [id number] [phone] (toll-free LBD Caregiver Link) www.lbda.[facility]org  National Institute of Mental Health [address], [address] [place], [place] [phone] [phone] (toll-free) [phone] [facility](TTY) http://www.maynard.net/www.nimh.nih.gov  National Organization for Rare Disorders [address] [place], [place] [phone]) (toll-free) www.rarediseases.org  The Dementias: Hope Through Research was jointly produced by the General Millsational Institute of Neurological Disorders and Stroke (NINDS) and the General Millsational Institute on Aging (NIA), both part of the Occidental Petroleumational Institutes of Health, the H. J. Heinznation's medical research agency--supporting scientific studies that turn discovery into health. NINDS is the nation's leading funder of research on the brain and nervous system. The NINDS mission is to reduce the burden of neurological disease. For more information and resources, visit ToledoAutomobile.co.ukwww.ninds.nih.gov [1] or call [phone]. NIA leads the federal government effort conducting and supporting research on aging and the health and well-being of older people. NIA's Alzheimer's Disease Education and Referral (ADEAR) Center offers information and publications on dementia and caregiving for families, caregivers, and professionals. For more information, visit CashCowGambling.bewww.nia.nih.gov/alzheimers [2] or call [phone]. Also available from NIA are publications and information about Alzheimer's disease as well as the booklets Frontotemporal Disorders: Information for Patients, Families, and Caregivers and Lewy Body Dementia: Information for Patients, Families, and Professionals. Source URL:  VoiceZap.ishttp://www.nia.nih.gov/alzheimers/publication/dementias/resources

## 2023-02-13 DIAGNOSIS — M79671 Pain in right foot: Secondary | ICD-10-CM | POA: Diagnosis not present

## 2023-02-16 ENCOUNTER — Telehealth: Payer: Self-pay | Admitting: Neurology

## 2023-02-16 NOTE — Telephone Encounter (Signed)
Neuropsychology referral faxed to [facility] office of Dr. Clayborn Heron (fax# [phone], phone# [phone])

## 2023-02-25 ENCOUNTER — Telehealth: Payer: Self-pay | Admitting: *Deleted

## 2023-02-25 NOTE — Telephone Encounter (Signed)
-----   Message from Anson Fret, MD sent at 02/17/2023  3:16 PM EDT ----- A high pTau181 concentration was observed. A normal beta-amyloid  42/40 ratio and normal concentration of NfL were observed at this  time. These results are not consistent with the presence of Alzheimer's-  related pathology, I recommend she continue to formal neurocognitive testing as planned thanks

## 2023-02-25 NOTE — Telephone Encounter (Signed)
I called and spoke to husband of pt.  I gave her the results of the labs that have come back.  Testing for markers wre not consistent with presence of ALZ related pathology.  Pt has appt with Dr. Roseanne Reno 04-13-2023 at 1230 with Dr. Roseanne Reno.  He verbalized understanding. Other testing is pending.

## 2023-03-05 LAB — ATN PROFILE
A -- Beta-amyloid 42/40 Ratio: 0.113 (ref >0.102)
Beta-amyloid 40: 207.31 pg/mL
Beta-amyloid 42: 23.4 pg/mL
N -- NfL, Plasma: 5.65 pg/mL (ref 0.00–11.55)
T -- p-tau181: 0.99 pg/mL — ABNORMAL HIGH (ref 0.00–0.97)

## 2023-03-05 LAB — APOE ALZHEIMER'S RISK

## 2023-04-13 DIAGNOSIS — G3184 Mild cognitive impairment, so stated: Secondary | ICD-10-CM | POA: Diagnosis not present

## 2023-04-13 DIAGNOSIS — R9089 Other abnormal findings on diagnostic imaging of central nervous system: Secondary | ICD-10-CM | POA: Diagnosis not present

## 2023-04-21 DIAGNOSIS — G3184 Mild cognitive impairment, so stated: Secondary | ICD-10-CM | POA: Diagnosis not present

## 2023-05-13 DIAGNOSIS — M25562 Pain in left knee: Secondary | ICD-10-CM | POA: Diagnosis not present

## 2023-05-18 DIAGNOSIS — M5442 Lumbago with sciatica, left side: Secondary | ICD-10-CM | POA: Diagnosis not present

## 2023-05-19 ENCOUNTER — Other Ambulatory Visit: Payer: Self-pay | Admitting: Orthopaedic Surgery

## 2023-05-19 DIAGNOSIS — M545 Low back pain, unspecified: Secondary | ICD-10-CM

## 2023-05-28 ENCOUNTER — Other Ambulatory Visit (HOSPITAL_BASED_OUTPATIENT_CLINIC_OR_DEPARTMENT_OTHER): Payer: Self-pay

## 2023-05-28 ENCOUNTER — Emergency Department (HOSPITAL_BASED_OUTPATIENT_CLINIC_OR_DEPARTMENT_OTHER): Payer: Medicare HMO | Admitting: Radiology

## 2023-05-28 ENCOUNTER — Encounter (HOSPITAL_BASED_OUTPATIENT_CLINIC_OR_DEPARTMENT_OTHER): Payer: Self-pay | Admitting: Emergency Medicine

## 2023-05-28 ENCOUNTER — Other Ambulatory Visit: Payer: Self-pay

## 2023-05-28 ENCOUNTER — Emergency Department (HOSPITAL_BASED_OUTPATIENT_CLINIC_OR_DEPARTMENT_OTHER)
Admission: EM | Admit: 2023-05-28 | Discharge: 2023-05-28 | Disposition: A | Discharge status: Home / Self Care | Payer: Medicare HMO | Attending: Emergency Medicine | Admitting: Emergency Medicine

## 2023-05-28 DIAGNOSIS — R9431 Abnormal electrocardiogram [ECG] [EKG]: Secondary | ICD-10-CM | POA: Diagnosis not present

## 2023-05-28 DIAGNOSIS — S134XXA Sprain of ligaments of cervical spine, initial encounter: Secondary | ICD-10-CM

## 2023-05-28 DIAGNOSIS — Z7982 Long term (current) use of aspirin: Secondary | ICD-10-CM | POA: Insufficient documentation

## 2023-05-28 DIAGNOSIS — M542 Cervicalgia: Secondary | ICD-10-CM | POA: Diagnosis present

## 2023-05-28 DIAGNOSIS — W1789XA Other fall from one level to another, initial encounter: Secondary | ICD-10-CM | POA: Diagnosis not present

## 2023-05-28 DIAGNOSIS — S139XXA Sprain of joints and ligaments of unspecified parts of neck, initial encounter: Secondary | ICD-10-CM

## 2023-05-28 DIAGNOSIS — S161XXA Strain of muscle, fascia and tendon at neck level, initial encounter: Secondary | ICD-10-CM

## 2023-05-28 DIAGNOSIS — Y92000 Kitchen of unspecified non-institutional (private) residence as  the place of occurrence of the external cause: Secondary | ICD-10-CM | POA: Diagnosis not present

## 2023-05-28 MED ORDER — LIDOCAINE 4 % EX PTCH
1.0000 | MEDICATED_PATCH | Freq: Two times a day (BID) | CUTANEOUS | 0 refills | Status: AC
  Filled 2023-05-28: qty 10, 5d supply, fill #0

## 2023-05-28 MED ORDER — ACETAMINOPHEN 500 MG PO TABS
500.0000 mg | ORAL_TABLET | Freq: Three times a day (TID) | ORAL | 0 refills | Status: AC | PRN
  Filled 2023-05-28: qty 15, 5d supply, fill #0

## 2023-05-28 NOTE — ED Provider Notes (Signed)
[place] EMERGENCY DEPARTMENT AT [facility] Provider Note   CSN: [id number] Arrival date & time: 05/28/23  0747     History  Chief Complaint  Patient presents with   Marletta Lor    Cheryl Nash is a 82 y.o. female.  HPI    82 year old female comes in with chief complaint of mechanical fall.  Patient states that she fell 2 days ago.  She was standing at the kitchen counter, and suddenly just lost balance and fell backwards.  She did not lose consciousness.  She recalls the act of falling, and also striking the back of her head.  Patient indicates that she did strike the back of her head onto a table.  There was no loss of consciousness because of the fall.  She did not notice any bruise and proceeded with her day.  Patient's husband quickly assessed her as well.  She has noticed that she has some discomfort with turning her head to the left side and over the left side of her neck since the fall.  Last night, her pain intensified therefore they decided to come to the emergency room to ensure that there is no fracture or pinched nerve.  Home Medications Prior to Admission medications   Medication Sig Start Date End Date Taking? Authorizing Provider  acetaminophen (TYLENOL) 500 MG tablet Take 1 tablet (500 mg total) by mouth every 8 (eight) hours as needed for moderate pain. 05/28/23  Yes Isador Castille, MD  lidocaine 4 % Place 1 patch onto the skin 2 (two) times daily. 05/28/23  Yes Derwood Kaplan, MD  aspirin EC 325 MG tablet Take 325-650 mg by mouth as needed for mild pain, moderate pain or fever.     [provider]  Calcium Citrate (CITRACAL PO) Take 1 tablet by mouth daily.    [provider]  chlorpheniramine (CHLOR-TRIMETON) 4 MG tablet Take 4 mg by mouth every 4 (four) hours as needed for allergies.    [provider]  cyanocobalamin 1000 MCG tablet Take 1,000 mcg by mouth daily.    [provider]  EPINEPHrine (EPIPEN 2-PAK) 0.3 mg/0.3  mL IJ SOAJ injection Inject 0.3 mg into the muscle once as needed (for allergic reaction).     [provider]  estradiol (ESTRACE) 0.1 MG/GM vaginal cream Place 1 Applicatorful vaginally 2 (two) times a week.     [provider]  levothyroxine (SYNTHROID, LEVOTHROID) 112 MCG tablet Take 112 mcg by mouth daily before breakfast.    [provider]  liothyronine (CYTOMEL) 5 MCG tablet Take 5 mcg by mouth daily.    [provider]  Multiple Vitamin (MULTIVITAMIN) tablet Take 1 tablet by mouth daily. Centrum silver    [provider]  nitroGLYCERIN (NITROSTAT) 0.4 MG SL tablet Place 0.4 mg under the tongue every 5 (five) minutes as needed for chest pain.    [provider]  Omega-3 Fatty Acids (FISH OIL) 1200 MG CAPS Take 1 capsule by mouth daily.    [provider]  pantoprazole (PROTONIX) 40 MG tablet Take 1 tablet (40 mg total) by mouth daily. 08/13/18   Meryl Dare, MD      Allergies    Levaquin [levofloxacin in d5w], Macrodantin [nitrofurantoin macrocrystal], Quinolones, Amoxicillin, Etodolac, Mometasone, Premarin [conjugated estrogens], Vioxx [rofecoxib], Ketek [telithromycin], Robaxin [methocarbamol], Aspartame and phenylalanine, Augmentin [amoxicillin-pot clavulanate], Celebrex [celecoxib], Cephalexin, Ciprofloxacin, Clindamycin/lincomycin, Codeine sulfate, Demerol [meperidine], Prilosec [omeprazole], and Septra [sulfamethoxazole-trimethoprim]    Review of Systems   Review of  Systems  All other systems reviewed and are negative.   Physical Exam Updated Vital Signs BP (!) 117/54   Pulse 60   Temp 98.2 F (36.8 C)   Resp 19   Ht 5\' 8"  (1.727 m)   Wt 81.6 kg   SpO2 95%   BMI 27.37 kg/m  Physical Exam Vitals and nursing note reviewed.  Constitutional:      Appearance: She is well-developed.  HENT:     Head: Atraumatic.  Neck:     Comments: No midline c-spine tenderness, pt able to turn head to 45 degrees  bilaterally without any pain and able to flex neck to the chest and extend without any pain or neurologic symptoms.  Patient has reproducible tenderness over the insertion point of the left sternocleidomastoid muscle and upper paraspinal cervical region Cardiovascular:     Rate and Rhythm: Normal rate.  Pulmonary:     Effort: Pulmonary effort is normal.  Musculoskeletal:     Cervical back: Tenderness present.  Skin:    General: Skin is warm and dry.  Neurological:     Mental Status: She is alert and oriented to person, place, and time.     ED Results / Procedures / Treatments   Labs (all labs ordered are listed, but only abnormal results are displayed) Labs Reviewed - No data to display  EKG EKG Interpretation Date/Time:  Thursday May 28 2023 07:58:29 EDT Ventricular Rate:  68 PR Interval:  163 QRS Duration:  92 QT Interval:  381 QTC Calculation: 406 R Axis:   30  Text Interpretation: Sinus rhythm Consider left atrial enlargement No acute changes No significant change since last tracing Confirmed by Derwood Kaplan [id number]) on 05/28/2023 8:42:11 AM  Radiology No results found.  Procedures Procedures    Medications Ordered in ED Medications - No data to display  ED Course/ Medical Decision Making/ A&P                             Medical Decision Making Risk OTC drugs.  82 year old patient comes in with chief complaint of neck pain from a fall.  Fall was 2 days ago.  The fall was not because of syncope.  Patient recalls the act of falling and striking the back of her head.  She has no associated neurologic symptoms.  She has reproducible left paraspinal tenderness.  She has no midline C-spine tenderness.  Differential diagnosis considered for this patient includes traumatic brain injury/intracranial bleed, cervical spine fracture, cervical spine sprain, sternocleidomastoid sprain, vascular injury such as vertebral dissection.  Based on the fact that the injury  occurred more than 48 hours ago, there is no new neurologic symptoms, patient has no midline C-spine tenderness and she has reproducible tenderness with turning her head to the left side and at the insertion site of the left SCM -my suspicion is that she has sternocleidomastoid strain.  Patient is a retired Engineer, civil (consulting).  She has a reliable husband with her.  I discussed with him that we can be aggressive and get a CT angiogram of her neck to ensure that there is no vascular or bony injury or we can wait and watch and have her follow-up with her doctor if her pain continues and perhaps she can get physical therapy and additional imaging at that time.  Patient states that she is supposed to follow-up with orthopedic spine doctors next week after MRI of her lumbar spine, she is comfortable discussing  her cervical spine pain with them if her symptoms are not better with conservative measurements.   We did order cervical spine x-rays, but orthopedic doctors typically get their own imaging in the clinic, therefore we will cancel it.  Final Clinical Impression(s) / ED Diagnoses Final diagnoses:  Whiplash injuries, initial encounter  Neck sprain, initial encounter  Strain of sternocleidomastoid muscle, initial encounter    Rx / DC Orders ED Discharge Orders          Ordered    acetaminophen (TYLENOL) 500 MG tablet  Every 8 hours PRN        05/28/23 0843    lidocaine 4 %  2 times daily        05/28/23 0843              Derwood Kaplan, MD 05/28/23 0930

## 2023-05-28 NOTE — ED Triage Notes (Signed)
Pt arrives pov, steady gait, endorses fall backwards this morning. Unsure if loc. Pt reports hitting head. Pt denies thinners. PT aox3, GCS 15. Spouse reports pt was awake , was by her side immediately.

## 2023-05-28 NOTE — Discharge Instructions (Addendum)
You were seen in the emergency room for neck pain. Based on our assessment, it appears that most likely your pain is because of cervical sprain and sternocleidomastoid strain.  Please perform the exercises that are requested throughout the day. Massage the area of tenderness at the base of your scalp. Continue to perform warm compresses to the back of your neck.  If your symptoms continue, then ensure you bring it up to the orthopedic spine doctors to see if you will benefit with physical therapy.  You are comfortable with no imaging at this time, however if you start developing severe pain in the back of your head, dizziness, vision changes, speech changes, one-sided weakness or numbness please return to the emergency room immediately.

## 2023-05-30 ENCOUNTER — Ambulatory Visit
Admission: RE | Admit: 2023-05-30 | Discharge: 2023-05-30 | Disposition: A | Discharge status: Home / Self Care | Payer: Medicare HMO | Source: Ambulatory Visit | Attending: Orthopaedic Surgery | Admitting: Orthopaedic Surgery

## 2023-05-30 DIAGNOSIS — M545 Low back pain, unspecified: Secondary | ICD-10-CM

## 2023-06-08 ENCOUNTER — Other Ambulatory Visit (HOSPITAL_BASED_OUTPATIENT_CLINIC_OR_DEPARTMENT_OTHER): Payer: Self-pay

## 2023-06-10 ENCOUNTER — Telehealth: Payer: Self-pay | Admitting: Neurology

## 2023-06-10 NOTE — Telephone Encounter (Signed)
Dr Marca Ancona notes are in epic

## 2023-06-10 NOTE — Telephone Encounter (Signed)
Pt's husband is asking for a call to discuss results that were sent to Dr Lucia Gaskins from Dr Roseanne Reno, spouse states these results are from pt's appointment on or around 04-13-23, please call pt spouse to discuss.

## 2023-06-11 NOTE — Telephone Encounter (Signed)
When would you like to see this patient to discuss results? Your next available is in Jan, can she wait that long or needs to be sooner?

## 2023-06-18 DIAGNOSIS — M5442 Lumbago with sciatica, left side: Secondary | ICD-10-CM | POA: Diagnosis not present

## 2023-06-18 NOTE — Telephone Encounter (Signed)
Make sure they have met with Dr. Roseanne Reno to review results, he usually meets with them to review and answer questions.  I don;t have anything else available, can we place her with an NP? Jillian/Angel can you help get her a sooner appt with an NP? Aundra Millet is the best for this.

## 2023-06-22 NOTE — Telephone Encounter (Signed)
Pt scheduled to see Shanda Bumps for 06/25/23 at 2:45pm

## 2023-06-22 NOTE — Telephone Encounter (Signed)
Cheryl Nash is booking out to March.

## 2023-06-22 NOTE — Telephone Encounter (Signed)
Another NP would be fine let me know who it is thanks

## 2023-06-24 NOTE — Telephone Encounter (Signed)
Okay sounds good. I will keep you updated after tomorrows visit. Thank you.

## 2023-06-24 NOTE — Telephone Encounter (Signed)
Absolutely! And aricept if appropriate. Thank you!

## 2023-06-24 NOTE — Progress Notes (Unsigned)
[facility] [address] [place]. [place] [address] [phone]       OFFICE FOLLOW UP NOTE  Ms. [name] Date of Birth:  1941/03/21 Medical Record Number:  [id number]    Primary neurologist: Dr. Daisy Blossom Reason for visit: Short-term memory loss    SUBJECTIVE:   CHIEF COMPLAINT:  No chief complaint on file.   HPI:   Update 06/25/2023 JM: Patient returns for follow-up visit and to discuss recent findings after neurocognitive evaluation with neuropsychology Dr. Roseanne Reno at [facility].  Neurocognitive evaluation 04/2023 was consistent with impaired memory performance and diagnosed with mild cognitive impairment.  Unsure exact etiology, although concerned about age related causes including possible Alzheimer's disease although markers show slightly elevated [id number] and normal Ab42/40 ratio leading to concern something else may be contributing to memory difficulties.  Discussed considering symptomatic treatment (such as Leqembi) and pursuing further workup or continued monitoring.         History provided for reference purposes only Consult visit 02/12/2023 Dr. Lucia Gaskins:  CAPITOLA MCCARTIN is a 82 y.o. female here as requested by Johny Blamer, MD for short-term memory loss. Long term memory goo. Started in the last year. She would ask the same questions over and over. Sheis unable to hold on short-term thoughts. She doesn't drive for 7-8 years but not for any problems, not getting lost. No episodesof confusion. She cooks, does the things she always done. She gets confused on the day of the week. They do yardwork and mowing. Physically active, they walk 3 miles a day. No depression or anxiety. A little less social due to virus but they still see family and neighbors and still interested in hobbies. They work puzzles, play games. Forgets recent conversations and events. Doesn't struggle finding the right word for an object. Sleeping well. Good at names and  annniversaries. She loses and misplaces things and puts them in places that may not be normal. Takes own medication, doesn't miss it. Husband has always paid the bills. Sometimes forgets steps to turn on TV. Father had parkinson's disease no dementia. No dementia in the family. Her husband and kids have noticed esp the repeating of things and asking the same questions in the same day. Husband provides much information. No other focal neurologic deficits, associated symptoms, inciting events or modifiable factors.         PERTINENT IMAGING/LABS  B12, tsh, cbc/cmp nml   12/03/2022: rpr neg, b12 > 1500, t3 total nml,ft4nml, tsh nml, BUN 12, creat 0.74   MRI brain 12/29/2022:  IMPRESSION: 1. No acute intracranial abnormality. 2. Progressive cerebral atrophy including severe bilateral temporal lobe atrophy. 3. Mild chronic small vessel ischemic disease.    ROS:   14 system review of systems performed and negative with exception of ***  PMH:  Past Medical History:  Diagnosis Date   Allergic rhinitis    Chronic constipation    Hepatitis    2003   TOXIC HEPATITIS   Histoplasmosis 1972   Hyperlipidemia    Hypoglycemia    Hypothyroidism    MVP (mitral valve prolapse)    Osteoporosis    Pancreatitis 2001   Pneumonia [phone]   Psoriasis     PSH:  Past Surgical History:  Procedure Laterality Date   ABDOMINAL HYSTERECTOMY     APPENDECTOMY     CATARACT EXTRACTION Bilateral 11/2015,12/2015   CHOLECYSTECTOMY     ESOPHAGEAL DILATION     KNEE ARTHROSCOPY     LIVER RESECTION  LUMBAR DISC SURGERY  05/2000   L4-5   LYSIS OF ADHESION  2001   pancreatic duct  1985   enlargement of duct   TONSILLECTOMY AND ADENOIDECTOMY  1948   TOTAL ABDOMINAL HYSTERECTOMY W/ BILATERAL SALPINGOOPHORECTOMY  1970   TOTAL HIP ARTHROPLASTY Left 12/23/2016   Procedure: TOTAL HIP ARTHROPLASTY ANTERIOR APPROACH;  Surgeon: Marcene Corning, MD;  Location: [facility] OR;  Service: Orthopedics;  Laterality: Left;     Social History:  Social History   Socioeconomic History   Marital status: Married    Spouse name: Ron   Number of children: 4   Years of education: Not on file   Highest education level: Not on file  Occupational History   Occupation: retired Charity fundraiser  Tobacco Use   Smoking status: Never   Smokeless tobacco: Never  Vaping Use   Vaping status: Never Used  Substance and Sexual Activity   Alcohol use: No    Alcohol/week: 0.0 standard drinks of alcohol   Drug use: No   Sexual activity: Not on file  Other Topics Concern   Not on file  Social History Narrative   Not on file   Social Determinants of Health   Financial Resource Strain: Not on file  Food Insecurity: Not on file  Transportation Needs: Not on file  Physical Activity: Not on file  Stress: Not on file  Social Connections: Not on file  Intimate Partner Violence: Not on file    Family History:  Family History  Problem Relation Age of Onset   Heart attack Mother    CAD Mother    Aneurysm Maternal Grandmother    Colon cancer Maternal Grandfather    Dementia Neg Hx    Alzheimer's disease Neg Hx     Medications:   Current Outpatient Medications on File Prior to Visit  Medication Sig Dispense Refill   acetaminophen (TYLENOL) 500 MG tablet Take 1 tablet (500 mg total) by mouth every 8 (eight) hours as needed for moderate pain. 15 tablet 0   aspirin EC 325 MG tablet Take 325-650 mg by mouth as needed for mild pain, moderate pain or fever.      Calcium Citrate (CITRACAL PO) Take 1 tablet by mouth daily.     chlorpheniramine (CHLOR-TRIMETON) 4 MG tablet Take 4 mg by mouth every 4 (four) hours as needed for allergies.     cyanocobalamin 1000 MCG tablet Take 1,000 mcg by mouth daily.     EPINEPHrine (EPIPEN 2-PAK) 0.3 mg/0.3 mL IJ SOAJ injection Inject 0.3 mg into the muscle once as needed (for allergic reaction).      estradiol (ESTRACE) 0.1 MG/GM vaginal cream Place 1 Applicatorful vaginally 2 (two) times a week.       levothyroxine (SYNTHROID, LEVOTHROID) 112 MCG tablet Take 112 mcg by mouth daily before breakfast.     lidocaine 4 % Place 1 patch onto the skin 2 (two) times daily. 10 patch 0   liothyronine (CYTOMEL) 5 MCG tablet Take 5 mcg by mouth daily.     Multiple Vitamin (MULTIVITAMIN) tablet Take 1 tablet by mouth daily. Centrum silver     nitroGLYCERIN (NITROSTAT) 0.4 MG SL tablet Place 0.4 mg under the tongue every 5 (five) minutes as needed for chest pain.     Omega-3 Fatty Acids (FISH OIL) 1200 MG CAPS Take 1 capsule by mouth daily.     pantoprazole (PROTONIX) 40 MG tablet Take 1 tablet (40 mg total) by mouth daily. 90 tablet 3   No current facility-administered  medications on file prior to visit.    Allergies:   Allergies  Allergen Reactions   Levaquin [Levofloxacin In D5w] Other (See Comments)    Toxic hepatitis   Macrodantin [Nitrofurantoin Macrocrystal] Anaphylaxis   Quinolones Other (See Comments)       HX TOXIC HEPATITIS   Amoxicillin Other (See Comments)    .Fatigue, agitation and depression Has patient had a PCN reaction causing immediate rash, facial/tongue/throat swelling, SOB or lightheadedness with hypotension: unknown Has patient had a PCN reaction causing severe rash involving mucus membranes or skin necrosis: Yes Has patient had a PCN reaction that required hospitalization: No Has patient had a PCN reaction occurring within the last 10 years: Unknown If all of the above answers are "NO", then may proceed with Cephalosporin use.     Etodolac Itching and Rash    whelps   Mometasone Other (See Comments)    RED, ITCHY, SWOLLEN EYE LIDS   Premarin [Conjugated Estrogens] Other (See Comments)    VAGINAL CREAM-  HEADACHE, NAUSEA, VAGINAL BURNING   Vioxx [Rofecoxib] Itching and Other (See Comments)    ITCHING, SEVERE LARYNGITIS   Ketek [Telithromycin]     UNSPECIFIED REACTION    Robaxin [Methocarbamol] Itching   Aspartame And Phenylalanine Rash   Augmentin  [Amoxicillin-Pot Clavulanate] Itching and Other (See Comments)    Insomnia Has patient had a PCN reaction causing immediate rash, facial/tongue/throat swelling, SOB or lightheadedness with hypotension: Unknown Has patient had a PCN reaction causing severe rash involving mucus membranes or skin necrosis: Yes Has patient had a PCN reaction that required hospitalization: No Has patient had a PCN reaction occurring within the last 10 years:Unknown If all of the above answers are "NO", then may proceed with Cephalosporin use.    Celebrex [Celecoxib] Itching   Cephalexin Rash and Other (See Comments)    dermatitis   Ciprofloxacin Rash and Other (See Comments)    dermatitis   Clindamycin/Lincomycin Nausea And Vomiting   Codeine Sulfate Itching and Rash   Demerol [Meperidine] Other (See Comments)    INVOLUNTARY TWITCHING    Prilosec [Omeprazole] Other (See Comments)    Abdominal pain    Septra [Sulfamethoxazole-Trimethoprim] Other (See Comments)    Headache, insomnia      OBJECTIVE:  Physical Exam  There were no vitals filed for this visit. There is no height or weight on file to calculate BMI. No results found.  General: well developed, well nourished, seated, in no evident distress Head: head normocephalic and atraumatic.   Neck: supple with no carotid or supraclavicular bruits Cardiovascular: regular rate and rhythm, no murmurs Musculoskeletal: no deformity Skin:  no rash/petichiae Vascular:  Normal pulses all extremities   Neurologic Exam Mental Status: Awake and fully alert. Oriented to place and time. Recent and remote memory intact. Attention span, concentration and fund of knowledge appropriate. Mood and affect appropriate.  Cranial Nerves: Pupils equal, briskly reactive to light. Extraocular movements full without nystagmus. Visual fields full to confrontation. Hearing intact. Facial sensation intact. Face, tongue, palate moves normally and symmetrically.  Motor: Normal  bulk and tone. Normal strength in all tested extremity muscles Sensory.: intact to touch , pinprick , position and vibratory sensation.  Coordination: Rapid alternating movements normal in all extremities. Finger-to-nose and heel-to-shin performed accurately bilaterally. Gait and Station: Arises from chair without difficulty. Stance is normal. Gait demonstrates normal stride length and balance with ***. Tandem walk and heel toe ***.  Reflexes: 1+ and symmetric. Toes downgoing.  ASSESSMENT/PLAN: ANALEY LATHEN is a 82 y.o. year old female with complaints of short-term memory loss over the past year.  Evaluated by neuropsychology 04/2023 through [facility] and diagnosed with mild cognitive impairment of unclear etiology     Mild cognitive impairment:   Aricept? PET scan? Per neuropsychologist Dr. Roseanne Reno, recommended repeat evaluation in 18-24 months or sooner if needed     Follow up in *** or call earlier if needed   CC:  [facility] provider: Dr. Pearlean Brownie PCP: Noberto Retort, MD    I spent *** minutes of face-to-face and non-face-to-face time with patient.  This included previsit chart review, lab review, study review, order entry, electronic health record documentation, patient and husband education and discussion regarding above diagnoses and treatment plan and answered all other questions to patient satisfaction   Ihor Austin, [facility]  [facility] [address] [place], [place] [address]  Phone [phone] Fax [phone] Note: This document was prepared with digital dictation and possible smart phrase technology. Any transcriptional errors that result from this process are unintentional.

## 2023-06-24 NOTE — Telephone Encounter (Signed)
If patient is interested, would you agree to proceeding with PET scan?

## 2023-06-25 ENCOUNTER — Ambulatory Visit: Payer: Medicare HMO | Admitting: Adult Health

## 2023-06-25 ENCOUNTER — Encounter: Payer: Self-pay | Admitting: Adult Health

## 2023-06-25 VITALS — BP 107/60 | HR 66 | Ht 69.0 in | Wt 180.0 lb

## 2023-06-25 DIAGNOSIS — R413 Other amnesia: Secondary | ICD-10-CM

## 2023-06-25 DIAGNOSIS — G3184 Mild cognitive impairment, so stated: Secondary | ICD-10-CM

## 2023-06-25 NOTE — Patient Instructions (Addendum)
Your Plan:  We will plan on monitoring your memory loss at this time  Can consider further evaluation with PET scan in the future if needed  Can consider medications such as Aricept or Namenda which can help slow memory decline but as your memory has been stable recently, no reason to start these medications yet  Continue to stay active, ensuring good sleep and routine exercise  We will plan on following up in 6 months for re-evaluation but please call with any worsening memory        Thank you for coming to see [place] at [facility]. I hope we have been able to provide you high quality care today.  You may receive a patient satisfaction survey over the next few weeks. We would appreciate your feedback and comments so that we may continue to improve ourselves and the health of our patients.

## 2023-08-06 DIAGNOSIS — M2559 Pain in other specified joint: Secondary | ICD-10-CM | POA: Diagnosis not present

## 2023-08-06 DIAGNOSIS — Z23 Encounter for immunization: Secondary | ICD-10-CM | POA: Diagnosis not present

## 2023-08-06 DIAGNOSIS — E559 Vitamin D deficiency, unspecified: Secondary | ICD-10-CM | POA: Diagnosis not present

## 2023-08-06 DIAGNOSIS — E7800 Pure hypercholesterolemia, unspecified: Secondary | ICD-10-CM | POA: Diagnosis not present

## 2023-08-06 DIAGNOSIS — E039 Hypothyroidism, unspecified: Secondary | ICD-10-CM | POA: Diagnosis not present

## 2023-08-06 DIAGNOSIS — J309 Allergic rhinitis, unspecified: Secondary | ICD-10-CM | POA: Diagnosis not present

## 2023-08-06 DIAGNOSIS — F09 Unspecified mental disorder due to known physiological condition: Secondary | ICD-10-CM | POA: Diagnosis not present

## 2023-08-06 DIAGNOSIS — Z0000 Encounter for general adult medical examination without abnormal findings: Secondary | ICD-10-CM | POA: Diagnosis not present

## 2023-08-06 DIAGNOSIS — Z9181 History of falling: Secondary | ICD-10-CM | POA: Diagnosis not present

## 2024-01-14 ENCOUNTER — Ambulatory Visit: Payer: Medicare HMO | Admitting: Adult Health

## 2024-02-09 DIAGNOSIS — J309 Allergic rhinitis, unspecified: Secondary | ICD-10-CM | POA: Diagnosis not present

## 2024-02-09 DIAGNOSIS — E039 Hypothyroidism, unspecified: Secondary | ICD-10-CM | POA: Diagnosis not present

## 2024-02-09 DIAGNOSIS — F09 Unspecified mental disorder due to known physiological condition: Secondary | ICD-10-CM | POA: Diagnosis not present

## 2024-02-09 DIAGNOSIS — E7800 Pure hypercholesterolemia, unspecified: Secondary | ICD-10-CM | POA: Diagnosis not present

## 2024-04-02 DIAGNOSIS — E7800 Pure hypercholesterolemia, unspecified: Secondary | ICD-10-CM | POA: Diagnosis not present

## 2024-04-02 DIAGNOSIS — E039 Hypothyroidism, unspecified: Secondary | ICD-10-CM | POA: Diagnosis not present

## 2024-05-02 DIAGNOSIS — E7800 Pure hypercholesterolemia, unspecified: Secondary | ICD-10-CM | POA: Diagnosis not present

## 2024-05-02 DIAGNOSIS — E039 Hypothyroidism, unspecified: Secondary | ICD-10-CM | POA: Diagnosis not present

## 2024-06-02 DIAGNOSIS — E039 Hypothyroidism, unspecified: Secondary | ICD-10-CM | POA: Diagnosis not present

## 2024-06-02 DIAGNOSIS — E7800 Pure hypercholesterolemia, unspecified: Secondary | ICD-10-CM | POA: Diagnosis not present

## 2024-07-03 DIAGNOSIS — E039 Hypothyroidism, unspecified: Secondary | ICD-10-CM | POA: Diagnosis not present

## 2024-07-03 DIAGNOSIS — E7800 Pure hypercholesterolemia, unspecified: Secondary | ICD-10-CM | POA: Diagnosis not present

## 2024-07-15 DIAGNOSIS — Z23 Encounter for immunization: Secondary | ICD-10-CM | POA: Diagnosis not present

## 2024-08-02 DIAGNOSIS — E039 Hypothyroidism, unspecified: Secondary | ICD-10-CM | POA: Diagnosis not present

## 2024-08-02 DIAGNOSIS — E7800 Pure hypercholesterolemia, unspecified: Secondary | ICD-10-CM | POA: Diagnosis not present

## 2024-08-19 DIAGNOSIS — M2559 Pain in other specified joint: Secondary | ICD-10-CM | POA: Diagnosis not present

## 2024-08-19 DIAGNOSIS — Z23 Encounter for immunization: Secondary | ICD-10-CM | POA: Diagnosis not present

## 2024-08-19 DIAGNOSIS — J309 Allergic rhinitis, unspecified: Secondary | ICD-10-CM | POA: Diagnosis not present

## 2024-08-19 DIAGNOSIS — Z0000 Encounter for general adult medical examination without abnormal findings: Secondary | ICD-10-CM | POA: Diagnosis not present

## 2024-08-19 DIAGNOSIS — R1310 Dysphagia, unspecified: Secondary | ICD-10-CM | POA: Diagnosis not present

## 2024-08-19 DIAGNOSIS — E7800 Pure hypercholesterolemia, unspecified: Secondary | ICD-10-CM | POA: Diagnosis not present

## 2024-08-19 DIAGNOSIS — F09 Unspecified mental disorder due to known physiological condition: Secondary | ICD-10-CM | POA: Diagnosis not present

## 2024-08-19 DIAGNOSIS — E039 Hypothyroidism, unspecified: Secondary | ICD-10-CM | POA: Diagnosis not present

## 2024-09-02 DIAGNOSIS — E7800 Pure hypercholesterolemia, unspecified: Secondary | ICD-10-CM | POA: Diagnosis not present

## 2024-09-02 DIAGNOSIS — E039 Hypothyroidism, unspecified: Secondary | ICD-10-CM | POA: Diagnosis not present

## 2024-10-02 DIAGNOSIS — E7800 Pure hypercholesterolemia, unspecified: Secondary | ICD-10-CM | POA: Diagnosis not present

## 2024-10-02 DIAGNOSIS — E039 Hypothyroidism, unspecified: Secondary | ICD-10-CM | POA: Diagnosis not present
